# Patient Record
Sex: Male | Born: 1990 | Race: White | Hispanic: No | Marital: Single | Smoking: Current every day smoker
Health system: Southern US, Community
[De-identification: ages and names within clinical notes are randomized; demographics above are authoritative.]

## PROBLEM LIST (undated history)

## (undated) DIAGNOSIS — F1010 Alcohol abuse, uncomplicated: Secondary | ICD-10-CM

## (undated) DIAGNOSIS — K029 Dental caries, unspecified: Secondary | ICD-10-CM

## (undated) DIAGNOSIS — F1410 Cocaine abuse, uncomplicated: Secondary | ICD-10-CM

---
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
## 2013-03-28 ENCOUNTER — Telehealth: Payer: Self-pay | Admitting: Medical

## 2013-03-28 NOTE — Telephone Encounter (Signed)
fyi

## 2013-06-18 ENCOUNTER — Emergency Department (HOSPITAL_COMMUNITY)
Admission: EM | Admit: 2013-06-18 | Discharge: 2013-06-18 | Disposition: A | Discharge status: Home / Self Care | Payer: Medicaid Other | Attending: Emergency Medicine | Admitting: Emergency Medicine

## 2013-06-18 ENCOUNTER — Encounter (HOSPITAL_COMMUNITY): Payer: Self-pay | Admitting: Emergency Medicine

## 2013-06-18 DIAGNOSIS — K089 Disorder of teeth and supporting structures, unspecified: Secondary | ICD-10-CM | POA: Insufficient documentation

## 2013-06-18 DIAGNOSIS — Z79899 Other long term (current) drug therapy: Secondary | ICD-10-CM | POA: Insufficient documentation

## 2013-06-18 DIAGNOSIS — K002 Abnormalities of size and form of teeth: Secondary | ICD-10-CM | POA: Insufficient documentation

## 2013-06-18 DIAGNOSIS — Z791 Long term (current) use of non-steroidal anti-inflammatories (NSAID): Secondary | ICD-10-CM | POA: Insufficient documentation

## 2013-06-18 DIAGNOSIS — K0889 Other specified disorders of teeth and supporting structures: Secondary | ICD-10-CM

## 2013-06-18 DIAGNOSIS — L42 Pityriasis rosea: Secondary | ICD-10-CM

## 2013-06-18 DIAGNOSIS — F17200 Nicotine dependence, unspecified, uncomplicated: Secondary | ICD-10-CM | POA: Insufficient documentation

## 2013-06-18 DIAGNOSIS — K0381 Cracked tooth: Secondary | ICD-10-CM | POA: Insufficient documentation

## 2013-06-18 DIAGNOSIS — K029 Dental caries, unspecified: Secondary | ICD-10-CM | POA: Insufficient documentation

## 2013-06-18 DIAGNOSIS — Z880 Allergy status to penicillin: Secondary | ICD-10-CM | POA: Insufficient documentation

## 2013-06-18 MED ORDER — TRAMADOL HCL 50 MG PO TABS: 50.0000 mg | ORAL_TABLET | Freq: Four times a day (QID) | ORAL | Status: DC | PRN

## 2013-06-18 NOTE — Discharge Instructions (Signed)
Please read and follow all provided instructions.  Your diagnoses today include:  1. Pityriasis rosea   2. Pain, dental     The exam and treatment you received today has been provided on an emergency basis only. This is not a substitute for complete medical or dental care.  Tests performed today include:  Vital signs. See below for your results today.   Medications prescribed:   Tramadol - narcotic-like pain medication  DO NOT drive or perform any activities that require you to be awake and alert because this medicine can make you drowsy.   Take any prescribed medications only as directed.  Home care instructions:  Follow any educational materials contained in this packet.  You can use steroid cream on itchy areas of rash or use oral benadryl as needed.   Follow-up instructions: Please follow-up with your dentist for further evaluation of your symptoms. If you do not have a dentist or primary care doctor -- see below for referral information.   Dental Assistance: See below for dental referrals  Return instructions:   Please return to the Emergency Department if you experience worsening symptoms.  Please return if you develop a fever, you develop more swelling in your face or neck, you have trouble breathing or swallowing food.  Please return if you have any other emergent concerns.  Additional Information:  Your vital signs today were: BP 125/70   Pulse 98   Temp(Src) 98.7 F (37.1 C) (Oral)   Resp 16   Ht 5\' 8"  (1.727 m)   Wt 135 lb (61.236 kg)   BMI 20.53 kg/m2   SpO2 99% If your blood pressure (BP) was elevated above 135/85 this visit, please have this repeated by your doctor within one month. -------------- Dental Care: Organization         Address  Phone  Notes  [facility] Department of [facility] [address] [place], [place] [phone] Accepts children up to age 70 who are enrolled in [place] or [place] Health Choice; pregnant  women with a Medicaid card; and children who have applied for Medicaid or [place] Health Choice, but were declined, whose parents can pay a reduced fee at time of service.  [facility]  [address], [place] [phone] Accepts children up to age 35 who are enrolled in [place] or [place] Health Choice; pregnant women with a Medicaid card; and children who have applied for Medicaid or [place] Health Choice, but were declined, whose parents can pay a reduced fee at time of service.  [facility]  [address] [place], [place] [phone] Patients are seen by appointment only. Walk-ins are not accepted. [facility] will see patients 69 years of age and older. Monday - Tuesday (8am-5pm) Most Wednesdays (8:30-5pm) $30 per visit, cash only  [facility]  [address], [facility] [phone] Patients are seen by appointment only. Walk-ins are not accepted. [facility] will see patients 27 years of age and older. One Wednesday Evening (Monthly: Volunteer Based).  $30 per visit, cash only  [employer]  [phone] for adults; Children under age 74, call Graduate Pediatric Dentistry at [phone]. Children aged 87-14, please call [phone] to request a pediatric application.  Dental services are provided in all areas of dental care including fillings, crowns and bridges, complete and partial dentures, implants, gum treatment, root canals, and  extractions. Preventive care is also provided. Treatment is provided to both adults and children. Patients are selected via a lottery and there is often a waiting list.   [facility] [address], [place]  [phone] [email or web address]   [facility] [address], [place], [place] [phone], [phone]. [phone] Second and Fourth Thursday of each month, opens at 6:30 AM; Clinic ends at 9 AM.  Patients are  seen on a first-come first-served basis, and a limited number are seen during each clinic.   [facility]  [address], [place], [place] [phone]   Eligibility Requirements You must have lived in [place], [place], or [place] counties for at least the last three months.   You cannot be eligible for state or federal sponsored [place] insurance, including [employer], [place], or [employer]are.   You generally cannot be eligible for healthcare insurance through your employer.    How to apply: Eligibility screenings are held every Tuesday and Wednesday afternoon from 1:00 pm until 4:00 pm. You do not need an appointment for the interview!  [facility] [address], [place], [place] [phone]   [facility]  [phone]   [facility]  [phone]   [facility]  [phone]

## 2013-06-18 NOTE — ED Provider Notes (Signed)
CSN: [id number]     Arrival date & time 06/18/13  1721 History   First MD Initiated Contact with Patient 06/18/13 1726     This chart was scribed for non-physician practitioner, Rhea Bleacher PA-C working with Toy Baker, MD by Arlan Organ, ED Scribe. This patient was seen in room TR09C/TR09C and the patient's care was started at 5:51 PM.   Chief Complaint  Patient presents with  . Rash   The history is provided by the patient. No language interpreter was used.    HPI Comments: Matthew Reyes is a 23 y.o. male who presents to the Emergency Department complaining of a sudden onset, moderate rash to the right thigh that initially appeared 1 week ago. He states the initial rash just came up, and denies any radiation or gradual increase in size. Pt reports noting new rashes to his lower abdomen,  back, and left forearm onset yesterday. Pt suspected the spots may possibly be ringworm as he reports previously having ringworm in the past. He denies any itching to the areas. Denies fever or chills.  Pt also reports dental pain to his top upper right molar. He states his filling may have fallen out. He admits to generalized pain to the area. He has tried ibuprofen with no improvement.   History reviewed. No pertinent past medical history. History reviewed. No pertinent past surgical history. No family history on file. History  Substance Use Topics  . Smoking status: Current Every Day Smoker -- 0.50 packs/day    Types: Cigarettes  . Smokeless tobacco: Not on file  . Alcohol Use: 1.2 oz/week    2 Shots of liquor per week     Comment: occasionally     Review of Systems  Constitutional: Negative for fever and chills.  HENT: Positive for dental problem. Negative for facial swelling and trouble swallowing.   Eyes: Negative for redness.  Respiratory: Negative for shortness of breath, wheezing and stridor.   Cardiovascular: Negative for chest pain.  Gastrointestinal: Negative for nausea and  vomiting.  Musculoskeletal: Negative for myalgias.  Skin: Positive for rash.  Neurological: Negative for light-headedness.  Psychiatric/Behavioral: Negative for confusion.    Allergies  Amoxicillin  Home Medications   Current Outpatient Rx  Name  Route  Sig  Dispense  Refill  . methocarbamol (ROBAXIN) 500 MG tablet   Oral   Take 1 tablet (500 mg total) by mouth 2 (two) times daily.   20 tablet   0   . naproxen (NAPROSYN) 500 MG tablet   Oral   Take 1 tablet (500 mg total) by mouth 2 (two) times daily.   30 tablet   0   . traMADol (ULTRAM) 50 MG tablet   Oral   Take 1 tablet (50 mg total) by mouth every 6 (six) hours as needed.   15 tablet   0     Triage Vitals: BP 125/70  Pulse 98  Temp(Src) 98.7 F (37.1 C) (Oral)  Resp 16  Ht 5\' 8"  (1.727 m)  Wt 135 lb (61.236 kg)  BMI 20.53 kg/m2  SpO2 99%  Physical Exam  Nursing note and vitals reviewed. Constitutional: He is oriented to person, place, and time. He appears well-developed and well-nourished.  HENT:  Head: Normocephalic and atraumatic.  Right Ear: Tympanic membrane, external ear and ear canal normal.  Left Ear: Tympanic membrane, external ear and ear canal normal.  Nose: Nose normal.  Mouth/Throat: Uvula is midline, oropharynx is clear and moist and mucous membranes  are normal. No trismus in the jaw. Abnormal dentition. Dental caries present. No dental abscesses or uvula swelling. No tonsillar abscesses.  Poor dentition. Broken tooth noted upper R molar. No signs of infection.   Eyes: Conjunctivae and EOM are normal. Pupils are equal, round, and reactive to light.  Neck: Normal range of motion. Neck supple.  No neck swelling or Lugwig's angina  Cardiovascular: Normal rate.   Pulmonary/Chest: Effort normal. No respiratory distress.  Musculoskeletal: Normal range of motion.  Neurological: He is alert and oriented to person, place, and time.  Skin: Skin is warm and dry.  Patient with few scattered ovoid  pink colored macules over abdomen, back, and forearms < 1cm diameter. There is a larger scaling macule R thigh. This rash is consistent with pityriasis rosea.   Psychiatric: He has a normal mood and affect. His behavior is normal.    ED Course  Procedures (including critical care time)  DIAGNOSTIC STUDIES: Oxygen Saturation is 99% on RA, Normal by my interpretation.    COORDINATION OF CARE: 5:55 PM- Will give tramadol for dental pain. Discussed treatment plan with pt at bedside and pt agreed to plan.     Labs Review Labs Reviewed - No data to display Imaging Review No results found.  EKG Interpretation   None      Vital signs reviewed and are as follows: Filed Vitals:   06/18/13 1730  BP: 125/70  Pulse: 98  Temp: 98.7 F (37.1 C)  Resp: 16   For rash: Counseled on course, he is to use benadryl if needed.   For tooth: Patient counseled to take prescribed medications as directed, return with worsening facial or neck swelling, and to follow-up with their dentist as soon as possible.   Patient counseled on use of narcotic pain medications. Counseled not to combine these medications with others containing tylenol. Urged not to drink alcohol, drive, or perform any other activities that requires focus while taking these medications. The patient verbalizes understanding and agrees with the plan.   MDM   1. Pityriasis rosea   2. Pain, dental    Rash: consistent with pityriasis rosea, herald patch on leg. Supportive treatment indicated.  Tooth: Patient with toothache.  No gross abscess.  Exam unconcerning for Ludwig's angina or other deep tissue infection in neck.  Will treat with pain medicine.  Urged patient to follow-up with dentist.     I personally performed the services described in this documentation, which was scribed in my presence. The recorded information has been reviewed and is accurate.   Renne CriglerJoshua Larayah Clute, PA-C 06/20/13 [phone]

## 2013-06-19 ENCOUNTER — Emergency Department (HOSPITAL_COMMUNITY)
Admission: EM | Admit: 2013-06-19 | Discharge: 2013-06-20 | Disposition: A | Discharge status: Home / Self Care | Payer: Medicaid Other | Attending: Emergency Medicine | Admitting: Emergency Medicine

## 2013-06-19 ENCOUNTER — Emergency Department (HOSPITAL_COMMUNITY): Payer: Medicaid Other

## 2013-06-19 ENCOUNTER — Encounter (HOSPITAL_COMMUNITY): Payer: Self-pay | Admitting: Emergency Medicine

## 2013-06-19 DIAGNOSIS — Z880 Allergy status to penicillin: Secondary | ICD-10-CM | POA: Insufficient documentation

## 2013-06-19 DIAGNOSIS — M25519 Pain in unspecified shoulder: Secondary | ICD-10-CM

## 2013-06-19 DIAGNOSIS — S43499A Other sprain of unspecified shoulder joint, initial encounter: Secondary | ICD-10-CM | POA: Insufficient documentation

## 2013-06-19 DIAGNOSIS — X58XXXA Exposure to other specified factors, initial encounter: Secondary | ICD-10-CM | POA: Insufficient documentation

## 2013-06-19 DIAGNOSIS — Y929 Unspecified place or not applicable: Secondary | ICD-10-CM | POA: Insufficient documentation

## 2013-06-19 DIAGNOSIS — S46811A Strain of other muscles, fascia and tendons at shoulder and upper arm level, right arm, initial encounter: Secondary | ICD-10-CM

## 2013-06-19 DIAGNOSIS — Y939 Activity, unspecified: Secondary | ICD-10-CM | POA: Insufficient documentation

## 2013-06-19 DIAGNOSIS — M546 Pain in thoracic spine: Secondary | ICD-10-CM | POA: Insufficient documentation

## 2013-06-19 DIAGNOSIS — S46819A Strain of other muscles, fascia and tendons at shoulder and upper arm level, unspecified arm, initial encounter: Principal | ICD-10-CM

## 2013-06-19 DIAGNOSIS — F17200 Nicotine dependence, unspecified, uncomplicated: Secondary | ICD-10-CM | POA: Insufficient documentation

## 2013-06-19 MED ORDER — OXYCODONE-ACETAMINOPHEN 5-325 MG PO TABS
1.0000 | ORAL_TABLET | Freq: Once | ORAL | Status: AC
  Administered 2013-06-19: 1 via ORAL
  Filled 2013-06-19: qty 1
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
## 2013-06-19 NOTE — ED Notes (Signed)
Dr. Rancour at bedside. 

## 2013-06-19 NOTE — ED Notes (Signed)
Pt returned from X-ray.  

## 2013-06-19 NOTE — ED Notes (Signed)
Report given to Susannah Carbin B. RN
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
## 2013-06-20 LAB — D-DIMER, QUANTITATIVE (NOT AT ARMC)

## 2013-06-20 MED ORDER — NAPROXEN 500 MG PO TABS: 500.0000 mg | ORAL_TABLET | Freq: Two times a day (BID) | ORAL | Status: DC

## 2013-06-20 MED ORDER — METHOCARBAMOL 500 MG PO TABS: 500.0000 mg | ORAL_TABLET | Freq: Two times a day (BID) | ORAL | Status: DC

## 2013-06-20 NOTE — Discharge Instructions (Signed)
Arthralgia  Your caregiver has diagnosed you as suffering from an arthralgia. Arthralgia means there is pain in a joint. This can come from many reasons including:  · Bruising the joint which causes soreness (inflammation) in the joint.  · Wear and tear on the joints which occur as we grow older (osteoarthritis).  · Overusing the joint.  · Various forms of arthritis.  · Infections of the joint.  Regardless of the cause of pain in your joint, most of these different pains respond to anti-inflammatory drugs and rest. The exception to this is when a joint is infected, and these cases are treated with antibiotics, if it is a bacterial infection.  HOME CARE INSTRUCTIONS   · Rest the injured area for as long as directed by your caregiver. Then slowly start using the joint as directed by your caregiver and as the pain allows. Crutches as directed may be useful if the ankles, knees or hips are involved. If the knee was splinted or casted, continue use and care as directed. If an stretchy or elastic wrapping bandage has been applied today, it should be removed and re-applied every 3 to 4 hours. It should not be applied tightly, but firmly enough to keep swelling down. Watch toes and feet for swelling, bluish discoloration, coldness, numbness or excessive pain. If any of these problems (symptoms) occur, remove the ace bandage and re-apply more loosely. If these symptoms persist, contact your caregiver or return to this location.  · For the first 24 hours, keep the injured extremity elevated on pillows while lying down.  · Apply ice for 15-20 minutes to the sore joint every couple hours while awake for the first half day. Then 03-04 times per day for the first 48 hours. Put the ice in a plastic bag and place a towel between the bag of ice and your skin.  · Wear any splinting, casting, elastic bandage applications, or slings as instructed.  · Only take over-the-counter or prescription medicines for pain, discomfort, or fever as  directed by your caregiver. Do not use aspirin immediately after the injury unless instructed by your physician. Aspirin can cause increased bleeding and bruising of the tissues.  · If you were given crutches, continue to use them as instructed and do not resume weight bearing on the sore joint until instructed.  Persistent pain and inability to use the sore joint as directed for more than 2 to 3 days are warning signs indicating that you should see a caregiver for a follow-up visit as soon as possible. Initially, a hairline fracture (break in bone) may not be evident on X-rays. Persistent pain and swelling indicate that further evaluation, non-weight bearing or use of the joint (use of crutches or slings as instructed), or further X-rays are indicated. X-rays may sometimes not show a small fracture until a week or 10 days later. Make a follow-up appointment with your own caregiver or one to whom we have referred you. A radiologist (specialist in reading X-rays) may read your X-rays. Make sure you know how you are to obtain your X-ray results. Do not assume everything is normal if you do not hear from us.  SEEK MEDICAL CARE IF:  Bruising, swelling, or pain increases.  SEEK IMMEDIATE MEDICAL CARE IF:   · Your fingers or toes are numb or blue.  · The pain is not responding to medications and continues to stay the same or get worse.  · The pain in your joint becomes severe.  · You   develop a fever over 102° F (38.9° C).  · It becomes impossible to move or use the joint.  MAKE SURE YOU:   · Understand these instructions.  · Will watch your condition.  · Will get help right away if you are not doing well or get worse.  Document Released: 05/25/2005 Document Revised: 08/17/2011 Document Reviewed: 01/11/2008  Exit[employer]® Patient Information ©2014 [employer].

## 2013-06-21 NOTE — ED Provider Notes (Signed)
Medical screening examination/treatment/procedure(s) were performed by non-physician practitioner and as supervising physician I was immediately available for consultation/collaboration.  Ceira Hoeschen T Bartt Gonzaga, MD 06/21/13 1934 

## 2014-08-24 ENCOUNTER — Encounter (HOSPITAL_COMMUNITY): Payer: Self-pay | Admitting: Emergency Medicine

## 2014-08-24 ENCOUNTER — Emergency Department (HOSPITAL_COMMUNITY)
Admission: EM | Admit: 2014-08-24 | Discharge: 2014-08-24 | Disposition: A | Discharge status: Home / Self Care | Payer: Medicaid Other | Attending: Emergency Medicine | Admitting: Emergency Medicine

## 2014-08-24 DIAGNOSIS — Z791 Long term (current) use of non-steroidal anti-inflammatories (NSAID): Secondary | ICD-10-CM | POA: Insufficient documentation

## 2014-08-24 DIAGNOSIS — Z880 Allergy status to penicillin: Secondary | ICD-10-CM | POA: Insufficient documentation

## 2014-08-24 DIAGNOSIS — Z720 Tobacco use: Secondary | ICD-10-CM | POA: Insufficient documentation

## 2014-08-24 DIAGNOSIS — K029 Dental caries, unspecified: Secondary | ICD-10-CM | POA: Insufficient documentation

## 2014-08-24 DIAGNOSIS — K088 Other specified disorders of teeth and supporting structures: Secondary | ICD-10-CM | POA: Insufficient documentation

## 2014-08-24 DIAGNOSIS — K002 Abnormalities of size and form of teeth: Secondary | ICD-10-CM | POA: Insufficient documentation

## 2014-08-24 DIAGNOSIS — Z79899 Other long term (current) drug therapy: Secondary | ICD-10-CM | POA: Insufficient documentation

## 2014-08-24 MED ORDER — DOXYCYCLINE HYCLATE 100 MG PO CAPS: 100.0000 mg | ORAL_CAPSULE | Freq: Two times a day (BID) | ORAL | Status: DC

## 2014-08-24 MED ORDER — NAPROXEN 500 MG PO TABS: 500.0000 mg | ORAL_TABLET | Freq: Two times a day (BID) | ORAL | Status: DC | PRN

## 2014-08-24 MED ORDER — HYDROCODONE-ACETAMINOPHEN 5-325 MG PO TABS
1.0000 | ORAL_TABLET | Freq: Once | ORAL | Status: AC
  Administered 2014-08-24: 1 via ORAL
  Filled 2014-08-24: qty 1

## 2014-08-24 MED ORDER — HYDROCODONE-ACETAMINOPHEN 5-325 MG PO TABS: 1.0000 | ORAL_TABLET | Freq: Four times a day (QID) | ORAL | Status: DC | PRN

## 2014-08-24 NOTE — Discharge Instructions (Signed)
Apply warm compresses to jaw throughout the day. Take antibiotic until finished, use sunscreen and wear skin protecting clothing. Alternate between naprosyn and norco as directed, as needed for pain but do not drive or operate machinery with pain medication use. Followup with a dentist is very important for ongoing evaluation and management of recurrent dental pain. Call the dentist listed above today, or use the list below to find a dentist. STOP SMOKING! Return to emergency department for emergent changing or worsening symptoms.    Dental Pain A tooth ache may be caused by cavities (tooth decay). Cavities expose the nerve of the tooth to air and hot or cold temperatures. It may come from an infection or abscess (also called a boil or furuncle) around your tooth. It is also often caused by dental caries (tooth decay). This causes the pain you are having. DIAGNOSIS  Your caregiver can diagnose this problem by exam. TREATMENT   If caused by an infection, it may be treated with medications which kill germs (antibiotics) and pain medications as prescribed by your caregiver. Take medications as directed.  Only take over-the-counter or prescription medicines for pain, discomfort, or fever as directed by your caregiver.  Whether the tooth ache today is caused by infection or dental disease, you should see your dentist as soon as possible for further care. SEEK MEDICAL CARE IF: The exam and treatment you received today has been provided on an emergency basis only. This is not a substitute for complete medical or dental care. If your problem worsens or new problems (symptoms) appear, and you are unable to meet with your dentist, call or return to this location. SEEK IMMEDIATE MEDICAL CARE IF:   You have a fever.  You develop redness and swelling of your face, jaw, or neck.  You are unable to open your mouth.  You have severe pain uncontrolled by pain medicine. MAKE SURE YOU:   Understand these  instructions.  Will watch your condition.  Will get help right away if you are not doing well or get worse. Document Released: 05/25/2005 Document Revised: 08/17/2011 Document Reviewed: 01/11/2008 [facility] Patient Information 2015 [place], [place]. This information is not intended to replace advice given to you by your health care provider. Make sure you discuss any questions you have with your health care provider.  Dental Caries Dental caries (also called tooth decay) is the most common oral disease. It can occur at any age but is more common in children and young adults.  HOW DENTAL CARIES DEVELOPS  The process of decay begins when bacteria and foods (particularly sugars and starches) combine in your mouth to produce plaque. Plaque is a substance that sticks to the hard, outer surface of a tooth (enamel). The bacteria in plaque produce acids that attack enamel. These acids may also attack the root surface of a tooth (cementum) if it is exposed. Repeated attacks dissolve these surfaces and create holes in the tooth (cavities). If left untreated, the acids destroy the other layers of the tooth.  RISK FACTORS  Frequent sipping of sugary beverages.   Frequent snacking on sugary and starchy foods, especially those that easily get stuck in the teeth.   Poor oral hygiene.   Dry mouth.   Substance abuse such as methamphetamine abuse.   Broken or poor-fitting dental restorations.   Eating disorders.   Gastroesophageal reflux disease (GERD).   Certain radiation treatments to the head and neck. SYMPTOMS In the early stages of dental caries, symptoms are seldom present. Sometimes  white, chalky areas may be seen on the enamel or other tooth layers. In later stages, symptoms may include:  Pits and holes on the enamel.  Toothache after sweet, hot, or cold foods or drinks are consumed.  Pain around the tooth.  Swelling around the tooth. DIAGNOSIS  Most of the time, dental caries  is detected during a regular dental checkup. A diagnosis is made after a thorough medical and dental history is taken and the surfaces of your teeth are checked for signs of dental caries. Sometimes special instruments, such as lasers, are used to check for dental caries. Dental X-ray exams may be taken so that areas not visible to the eye (such as between the contact areas of the teeth) can be checked for cavities.  TREATMENT  If dental caries is in its early stages, it may be reversed with a fluoride treatment or an application of a remineralizing agent at the dental office. Thorough brushing and flossing at home is needed to aid these treatments. If it is in its later stages, treatment depends on the location and extent of tooth destruction:   If a small area of the tooth has been destroyed, the destroyed area will be removed and cavities will be filled with a material such as gold, silver amalgam, or composite resin.   If a large area of the tooth has been destroyed, the destroyed area will be removed and a cap (crown) will be fitted over the remaining tooth structure.   If the center part of the tooth (pulp) is affected, a procedure called a root canal will be needed before a filling or crown can be placed.   If most of the tooth has been destroyed, the tooth may need to be pulled (extracted). HOME CARE INSTRUCTIONS You can prevent, stop, or reverse dental caries at home by practicing good oral hygiene. Good oral hygiene includes:  Thoroughly cleaning your teeth at least twice a day with a toothbrush and dental floss.   Using a fluoride toothpaste. A fluoride mouth rinse may also be used if recommended by your dentist or health care provider.   Restricting the amount of sugary and starchy foods and sugary liquids you consume.   Avoiding frequent snacking on these foods and sipping of these liquids.   Keeping regular visits with a dentist for checkups and cleanings. PREVENTION    Practice good oral hygiene.  Consider a dental sealant. A dental sealant is a coating material that is applied by your dentist to the pits and grooves of teeth. The sealant prevents food from being trapped in them. It may protect the teeth for several years.  Ask about fluoride supplements if you live in a community without fluorinated water or with water that has a low fluoride content. Use fluoride supplements as directed by your dentist or health care provider.  Allow fluoride varnish applications to teeth if directed by your dentist or health care provider. Document Released: 02/14/2002 Document Revised: 10/09/2013 Document Reviewed: 05/27/2012 [facility] Patient Information 2015 [place]. This information is not intended to replace advice given to you by your health care provider. Make sure you discuss any questions you have with your health care provider.   Emergency Department Resource Guide 1) Find a Doctor and Pay Out of Pocket Although you won't have to find out who is covered by your insurance plan, it is a good idea to ask around and get recommendations. You will then need to call the office and see if the doctor  you have chosen will accept you as a new patient and what types of options they offer for patients who are self-pay. Some doctors offer discounts or will set up payment plans for their patients who do not have insurance, but you will need to ask so you aren't surprised when you get to your appointment.  2) Contact Your Local Health Department Not all health departments have doctors that can see patients for sick visits, but many do, so it is worth a call to see if yours does. If you don't know where your local health department is, you can check in your phone book. The CDC also has a tool to help you locate your state's health department, and many state websites also have listings of all of their local health departments.  3) Find a Walk-in Clinic If your illness is  not likely to be very severe or complicated, you may want to try a walk in clinic. These are popping up all over the country in pharmacies, drugstores, and shopping centers. They're usually staffed by nurse practitioners or physician assistants that have been trained to treat common illnesses and complaints. They're usually fairly quick and inexpensive. However, if you have serious medical issues or chronic medical problems, these are probably not your best option.  No Primary Care Doctor: - Call Health Connect at  [phone] - they can help you locate a primary care doctor that  accepts your insurance, provides certain services, etc. - Physician Referral Service- [phone]  Chronic Pain Problems: Organization         Address  Phone   Notes  [facility]  [phone] Patients need to be referred by their primary care doctor.   Medication Assistance: Organization         Address  Phone   Notes  [facility] Medication [facility] [address] [place], [place] [address] [phone] --Must be a resident of [facility] -- Must have NO insurance coverage whatsoever (no Medicaid/ Medicare, etc.) -- The pt. MUST have a primary care doctor that directs their care regularly and follows them in the community   [facility]  [phone]   [place]  [phone]     Dental Care: Organization         Address  Phone  Notes  [facility] Department of [facility] [address] [place], [place] [phone] Accepts children up to age 81 who are enrolled in [place] or [place] Health Choice; pregnant women with a Medicaid card; and children who have applied for Medicaid or [place] Health Choice, but were declined, whose parents can pay a reduced fee at time of service.  [facility] Department of [facility]  [address], [place] [phone] Accepts children up to age 29 who are enrolled in [place]  or [place] Health Choice; pregnant women with a Medicaid card; and children who have applied for Medicaid or [place] Health Choice, but were declined, whose parents can pay a reduced fee at time of service.  [facility]  [address] [place], [place] [phone] Patients are seen by appointment only. Walk-ins are not accepted. [facility] will see patients 67 years of age and older. Monday - Tuesday (8am-5pm) Most Wednesdays (8:30-5pm) $30 per visit, cash only  [facility]  [address], [facility] [phone] Patients are seen by appointment only. Walk-ins are not  accepted. [facility] will see patients 73 years of age and older. One Wednesday Evening (Monthly: Volunteer Based).  $30 per visit, cash only  [employer]  [phone] for adults; Children under age 38, call Graduate Pediatric Dentistry at [phone]. Children aged 48-14, please call [phone] to request a pediatric application.  Dental services are provided in all areas of dental care including fillings, crowns and bridges, complete and partial dentures, implants, gum treatment, root canals, and extractions. Preventive care is also provided. Treatment is provided to both adults and children. Patients are selected via a lottery and there is often a waiting list.   [facility] [address], [place]  [phone] [email or web address]   [facility] [address] [place], [place] [phone], Ext. [phone] Second and Fourth Thursday of each month, opens at 6:30 AM; Clinic ends at 9 AM.  Patients are seen on a first-come first-served basis, and a limited number are seen during each clinic.   [facility]  [address] [place], [place] [phone]   Eligibility Requirements You must have lived in [place], [place], or [place] counties for at least the last three months.   You cannot be eligible for  state or federal sponsored [place], including [employer], [place], or [employer].   You generally cannot be eligible for healthcare insurance through your employer.    How to apply: Eligibility screenings are held every Tuesday and Wednesday afternoon from 1:00 pm until 4:00 pm. You do not need an appointment for the interview!  [facility] [address], [place], [place] [phone]   [facility] Department  [phone]   [facility]  [phone]   [facility]  [phone]

## 2014-08-24 NOTE — ED Provider Notes (Signed)
CSN: [id number]     Arrival date & time 08/24/14  1101 History   First MD Initiated Contact with Patient 08/24/14 1128     Chief Complaint  Patient presents with  . Dental Pain    Onset was yesterday     (Consider location/radiation/quality/duration/timing/severity/associated sxs/prior Treatment) HPI Comments: Matthew Reyes is a 24 y.o. Male who presents to the ED with complaints of right upper molar dental pain that began last night after the entire tooth broke off. He reports he had pain in this tooth for several months, but yesterday when it broke and became much worse. He reports the pain is 9/10 intermittent throbbing, nonradiating, worse with cold air exposure, improved mildly with warm liquids, and unrelieved with ibuprofen. He admits to smoking and does not have dental care. Denies any facial or come swelling, come drainage or bleeding, trismus, drooling, difficulty swallowing, sore throat, neck pain or swelling, ear pain or drainage, fevers, chest pain, shortness breath, abdominal pain, nausea, or vomiting.  Patient is a 24 y.o. male presenting with tooth pain. The history is provided by the patient. No language interpreter was used.  Dental Pain Location:  Upper Upper teeth location:  1/RU 3rd molar Quality:  Throbbing Severity:  Severe (9/10) Onset quality:  Sudden Duration:  1 day Timing:  Intermittent Progression:  Unchanged Chronicity:  New Context: dental fracture (entire tooth broke off) and poor dentition   Relieved by:  Heat Exacerbated by: cold air. Ineffective treatments:  NSAIDs Associated symptoms: no congestion, no difficulty swallowing, no drooling, no facial pain, no facial swelling, no fever, no gum swelling, no headaches, no neck pain, no neck swelling, no oral bleeding, no oral lesions and no trismus   Risk factors: lack of dental care and smoking   Risk factors: no diabetes and no immunosuppression     History reviewed. No pertinent past medical  history. History reviewed. No pertinent past surgical history. No family history on file. History  Substance Use Topics  . Smoking status: Current Every Day Smoker -- 0.50 packs/day    Types: Cigarettes  . Smokeless tobacco: Not on file  . Alcohol Use: 1.2 oz/week    2 Shots of liquor per week     Comment: occasionally     Review of Systems  Constitutional: Negative for fever and chills.  HENT: Positive for dental problem. Negative for congestion, drooling, ear discharge, ear pain, facial swelling, mouth sores, rhinorrhea, sore throat and trouble swallowing.   Respiratory: Negative for shortness of breath.   Cardiovascular: Negative for chest pain.  Gastrointestinal: Negative for nausea, vomiting, abdominal pain and diarrhea.  Musculoskeletal: Negative for myalgias, arthralgias and neck pain.  Skin: Negative for color change.  Allergic/Immunologic: Negative for immunocompromised state.  Neurological: Negative for weakness, numbness and headaches.   10 Systems reviewed and are negative for acute change except as noted in the HPI.    Allergies  Amoxicillin  Home Medications   Prior to Admission medications   Medication Sig Start Date End Date Taking? Authorizing Provider  methocarbamol (ROBAXIN) 500 MG tablet Take 1 tablet (500 mg total) by mouth 2 (two) times daily. 06/20/13   Glynn Octave, MD  naproxen (NAPROSYN) 500 MG tablet Take 1 tablet (500 mg total) by mouth 2 (two) times daily. 06/20/13   Glynn Octave, MD  traMADol (ULTRAM) 50 MG tablet Take 1 tablet (50 mg total) by mouth every 6 (six) hours as needed. 06/18/13   Renne Crigler, PA-C   BP 125/86 mmHg  Pulse 80  Temp(Src) 98 F (36.7 C) (Oral)  Resp 16  Wt 140 lb (63.504 kg)  SpO2 99% Physical Exam  Constitutional: He is oriented to person, place, and time. Vital signs are normal. He appears well-developed and well-nourished.  Non-toxic appearance. No distress.  Afebrile, nontoxic, NAD  HENT:  Head:  Normocephalic and atraumatic.  Nose: Nose normal.  Mouth/Throat: Oropharynx is clear and moist and mucous membranes are normal. No trismus in the jaw. Abnormal dentition. Dental caries present. No dental abscesses or uvula swelling.    Poor oral dentitia throughout. L upper molar #1 completely broken off with TTP to the exposed gumline, without erythema or swelling, no drainage, no trismus or drooling, oropharynx clear and moist without tonsillar swelling or exudates. Nose clear. No facial swelling.  Eyes: Conjunctivae and EOM are normal. Right eye exhibits no discharge. Left eye exhibits no discharge.  Neck: Normal range of motion. Neck supple.  Cardiovascular: Normal rate.   Pulmonary/Chest: Effort normal. No respiratory distress.  Abdominal: Normal appearance. He exhibits no distension.  Musculoskeletal: Normal range of motion.  Lymphadenopathy:       Head (right side): Submandibular adenopathy present.  Mild R sided submandibular reactive LAD, mildly TTP  Neurological: He is alert and oriented to person, place, and time. He has normal strength. No sensory deficit.  Skin: Skin is warm, dry and intact. No rash noted.  Psychiatric: He has a normal mood and affect.  Nursing note and vitals reviewed.   ED Course  Procedures (including critical care time) Labs Review Labs Reviewed - No data to display  Imaging Review No results found.   EKG Interpretation None      MDM   Final diagnoses:  Pain due to dental caries  Dental decay    24 y.o. male here with Dental pain associated with broken tooth (completely fell out) and possible dental infection with patient afebrile, non toxic appearing and swallowing secretions well. I gave patient referral to dentist and stressed the importance of dental follow up for ultimate management of dental pain.  I have also discussed reasons to return immediately to the ER.  Patient expresses understanding and agrees with plan.  I will also give  doxycycline and pain control. Counseled on smoking cessation.  BP 125/86 mmHg  Pulse 80  Temp(Src) 98 F (36.7 C) (Oral)  Resp 16  Wt 140 lb (63.504 kg)  SpO2 99%  Meds ordered this encounter  Medications  . HYDROcodone-acetaminophen (NORCO/VICODIN) 5-325 MG per tablet 1 tablet    Sig:   . doxycycline (VIBRAMYCIN) 100 MG capsule    Sig: Take 1 capsule (100 mg total) by mouth 2 (two) times daily. One po bid x 7 days    Dispense:  14 capsule    Refill:  0    Order Specific Question:  Supervising Provider    Answer:  MILLER, BRIAN [[id number]]  . HYDROcodone-acetaminophen (NORCO) 5-325 MG per tablet    Sig: Take 1 tablet by mouth every 6 (six) hours as needed for severe pain.    Dispense:  15 tablet    Refill:  0    Order Specific Question:  Supervising Provider    Answer:  MILLER, BRIAN [[id number]]  . naproxen (NAPROSYN) 500 MG tablet    Sig: Take 1 tablet (500 mg total) by mouth 2 (two) times daily as needed for mild pain, moderate pain or headache (TAKE WITH MEALS.).    Dispense:  20 tablet    Refill:  0  Order Specific Question:  Supervising Provider    Answer:  Eber Hong [address] [place], PA-C 08/24/14 1159  Tilden Fossa, MD 08/24/14 1501

## 2014-11-12 ENCOUNTER — Encounter (HOSPITAL_COMMUNITY): Payer: Self-pay | Admitting: Emergency Medicine

## 2014-11-12 ENCOUNTER — Emergency Department (HOSPITAL_COMMUNITY)
Admission: EM | Admit: 2014-11-12 | Discharge: 2014-11-12 | Disposition: A | Discharge status: Home / Self Care | Payer: Medicaid Other | Attending: Emergency Medicine | Admitting: Emergency Medicine

## 2014-11-12 DIAGNOSIS — K029 Dental caries, unspecified: Secondary | ICD-10-CM | POA: Insufficient documentation

## 2014-11-12 DIAGNOSIS — Z791 Long term (current) use of non-steroidal anti-inflammatories (NSAID): Secondary | ICD-10-CM | POA: Insufficient documentation

## 2014-11-12 DIAGNOSIS — G8929 Other chronic pain: Secondary | ICD-10-CM | POA: Insufficient documentation

## 2014-11-12 DIAGNOSIS — Z880 Allergy status to penicillin: Secondary | ICD-10-CM | POA: Insufficient documentation

## 2014-11-12 DIAGNOSIS — Z79899 Other long term (current) drug therapy: Secondary | ICD-10-CM | POA: Insufficient documentation

## 2014-11-12 DIAGNOSIS — Z720 Tobacco use: Secondary | ICD-10-CM | POA: Insufficient documentation

## 2014-11-12 HISTORY — DX: Dental caries, unspecified: K02.9

## 2014-11-12 MED ORDER — DOXYCYCLINE HYCLATE 100 MG PO TABS
100.0000 mg | ORAL_TABLET | Freq: Once | ORAL | Status: AC
  Administered 2014-11-12: 100 mg via ORAL
  Filled 2014-11-12: qty 1

## 2014-11-12 MED ORDER — DOXYCYCLINE HYCLATE 100 MG PO CAPS: 100.0000 mg | ORAL_CAPSULE | Freq: Two times a day (BID) | ORAL | Status: DC

## 2014-11-12 MED ORDER — HYDROCODONE-ACETAMINOPHEN 5-325 MG PO TABS
2.0000 | ORAL_TABLET | Freq: Once | ORAL | Status: AC
  Administered 2014-11-12: 2 via ORAL
  Filled 2014-11-12: qty 2

## 2014-11-12 MED ORDER — HYDROCODONE-ACETAMINOPHEN 5-325 MG PO TABS: 2.0000 | ORAL_TABLET | ORAL | Status: DC | PRN

## 2014-11-12 NOTE — ED Notes (Signed)
Pt reports pain in r/upper jaw x 2 days

## 2014-11-12 NOTE — ED Provider Notes (Signed)
CSN: [id number]     Arrival date & time 11/12/14  1656 History  This chart was scribed for non-physician practitioner, Catha Gosselin, working with Gerhard Munch, MD by Richarda Overlie, ED Scribe. This patient was seen in room WTR7/WTR7 and the patient's care was started at 5:29 PM.   Chief Complaint  Patient presents with  . Dental Pain    r/upper jaw pain   The history is provided by the patient and a friend. No language interpreter was used.   HPI Comments: Matthew Reyes is a 24 y.o. male who presents to the Emergency Department complaining of right upper dental pain for the last 2 days. Pt reports he broke a right upper molar about 1 year ago. Pt reports a similar prior episode and says he thinks he was told it was due to nerve pain. He states that he has left over doxycycline from his last episode. Pt states that he last saw a dentist when he was 24 yo. Pt reports he is allergic to amoxicillin. He denies fever, facial swelling, trismus, difficulty swallowing, sore throat, neck pain, nausea or vomiting.  Past Medical History  Diagnosis Date  . Dental caries    History reviewed. No pertinent past surgical history. History reviewed. No pertinent family history. History  Substance Use Topics  . Smoking status: Current Every Day Smoker -- 0.50 packs/day    Types: Cigarettes  . Smokeless tobacco: Not on file  . Alcohol Use: 1.2 oz/week    2 Shots of liquor per week     Comment: occasionally     Review of Systems  Constitutional: Negative for fever.  HENT: Positive for dental problem. Negative for facial swelling, sore throat and trouble swallowing.   Gastrointestinal: Negative for nausea and vomiting.  Musculoskeletal: Negative for neck pain.   Allergies  Amoxicillin  Home Medications   Prior to Admission medications   Medication Sig Start Date End Date Taking? Authorizing Provider  doxycycline (VIBRAMYCIN) 100 MG capsule Take 1 capsule (100 mg total) by mouth 2  (two) times daily. 11/12/14   Rosia Syme Patel-Mills, PA-C  HYDROcodone-acetaminophen (NORCO/VICODIN) 5-325 MG per tablet Take 2 tablets by mouth every 4 (four) hours as needed. 11/12/14   Jayliana Valencia Patel-Mills, PA-C  ibuprofen (ADVIL,MOTRIN) 200 MG tablet Take 200 mg by mouth every 6 (six) hours as needed for mild pain.    Historical Provider, MD  methocarbamol (ROBAXIN) 500 MG tablet Take 1 tablet (500 mg total) by mouth 2 (two) times daily. 06/20/13   Glynn Octave, MD  naproxen (NAPROSYN) 500 MG tablet Take 1 tablet (500 mg total) by mouth 2 (two) times daily. 06/20/13   Glynn Octave, MD  naproxen (NAPROSYN) 500 MG tablet Take 1 tablet (500 mg total) by mouth 2 (two) times daily as needed for mild pain, moderate pain or headache (TAKE WITH MEALS.). 08/24/14   Mercedes Camprubi-Soms, PA-C  traMADol (ULTRAM) 50 MG tablet Take 1 tablet (50 mg total) by mouth every 6 (six) hours as needed. 06/18/13   Renne Crigler, PA-C   BP 133/91 mmHg  Pulse 83  Temp(Src) 99 F (37.2 C) (Oral)  Resp 22  SpO2 100% Physical Exam  Constitutional: He is oriented to person, place, and time. He appears well-developed and well-nourished.  HENT:  Head: Normocephalic and atraumatic.  Mouth/Throat: Uvula is midline and oropharynx is clear and moist. No trismus in the jaw. Abnormal dentition. Dental caries present. No dental abscesses, uvula swelling or lacerations.  Dental caries throughout. Right upper molar TTP.  No facial swelling. No abscess palpated. No new tooth avulsion or fracture.   Eyes: Conjunctivae are normal.  Neck: Neck supple. No tracheal deviation present.  Cardiovascular: Normal rate.   Pulmonary/Chest: Effort normal. No respiratory distress.  Musculoskeletal: Normal range of motion.  Neurological: He is alert and oriented to person, place, and time.  Skin: Skin is warm and dry.  Psychiatric: He has a normal mood and affect.  Nursing note and vitals reviewed.   ED Course  Procedures   DIAGNOSTIC  STUDIES: Oxygen Saturation is 100% on RA, normal by my interpretation.    COORDINATION OF CARE: 5:36 PM Discussed treatment plan with pt at bedside and pt agreed to plan.   Labs Review Labs Reviewed - No data to display  Imaging Review No results found.   EKG Interpretation None      MDM   Final diagnoses:  Dental caries  Patient presents for chronic dental pain.  He has been seen multiple times in the ED for the same. He is afebrile and in no acute distress.  He has no palpable dental abscess but will treat in case this is the beginning of an infection.  Medications  HYDROcodone-acetaminophen (NORCO/VICODIN) 5-325 MG per tablet 2 tablet (2 tablets Oral Given 11/12/14 1751)  doxycycline (VIBRA-TABS) tablet 100 mg (100 mg Oral Given 11/12/14 1751)   I have given him 12 hydrocodone for pain and a doxycycline prescription since he is allergic to amoxicillin. He has been non compliant with antibiotics in the past. I gave him the resource guide to find a dentist and I also gave him an affordable dentist, [place]anna Civil contact information.  I personally performed the services described in this documentation, which was scribed in my presence. The recorded information has been reviewed and is accurate.      Catha GosselinHanna Patel-Mills, PA-C 11/12/14 2229  Gerhard Munchobert Lockwood, MD 11/12/14 [phone]

## 2014-11-12 NOTE — Discharge Instructions (Signed)
Dental Pain Take pain medication and anti-biotics as prescribed. Follow up with a dentist using the resource guide or with [facility]. A tooth ache may be caused by cavities (tooth decay). Cavities expose the nerve of the tooth to air and hot or cold temperatures. It may come from an infection or abscess (also called a boil or furuncle) around your tooth. It is also often caused by dental caries (tooth decay). This causes the pain you are having. DIAGNOSIS  Your caregiver can diagnose this problem by exam. TREATMENT   If caused by an infection, it may be treated with medications which kill germs (antibiotics) and pain medications as prescribed by your caregiver. Take medications as directed.  Only take over-the-counter or prescription medicines for pain, discomfort, or fever as directed by your caregiver.  Whether the tooth ache today is caused by infection or dental disease, you should see your dentist as soon as possible for further care. SEEK MEDICAL CARE IF: The exam and treatment you received today has been provided on an emergency basis only. This is not a substitute for complete medical or dental care. If your problem worsens or new problems (symptoms) appear, and you are unable to meet with your dentist, call or return to this location. SEEK IMMEDIATE MEDICAL CARE IF:   You have a fever.  You develop redness and swelling of your face, jaw, or neck.  You are unable to open your mouth.  You have severe pain uncontrolled by pain medicine. MAKE SURE YOU:   Understand these instructions.  Will watch your condition.  Will get help right away if you are not doing well or get worse. Document Released: 05/25/2005 Document Revised: 08/17/2011 Document Reviewed: 01/11/2008 [facility] Patient Information 2015 [place], [place]. This information is not intended to replace advice given to you by your health care provider. Make sure you discuss any questions you have with your health care  provider.  Emergency Department Resource Guide 1) Find a Doctor and Pay Out of Pocket Although you won't have to find out who is covered by your insurance plan, it is a good idea to ask around and get recommendations. You will then need to call the office and see if the doctor you have chosen will accept you as a new patient and what types of options they offer for patients who are self-pay. Some doctors offer discounts or will set up payment plans for their patients who do not have insurance, but you will need to ask so you aren't surprised when you get to your appointment.  2) Contact Your Local Health Department Not all health departments have doctors that can see patients for sick visits, but many do, so it is worth a call to see if yours does. If you don't know where your local health department is, you can check in your phone book. The CDC also has a tool to help you locate your state's health department, and many state websites also have listings of all of their local health departments.  3) Find a Walk-in Clinic If your illness is not likely to be very severe or complicated, you may want to try a walk in clinic. These are popping up all over the country in pharmacies, drugstores, and shopping centers. They're usually staffed by nurse practitioners or physician assistants that have been trained to treat common illnesses and complaints. They're usually fairly quick and inexpensive. However, if you have serious medical issues or chronic medical problems, these are probably not your best option.  No Primary Care Doctor: - Call Health Connect at  [phone] - they can help you locate a primary care doctor that  accepts your insurance, provides certain services, etc. - Physician Referral Service- [phone]  Chronic Pain Problems: Organization         Address  Phone   Notes  [facility]  [phone] Patients need to be referred by their primary care doctor.   Medication  Assistance: Organization         Address  Phone   Notes  [facility]ication [facility] Program [address] [place], [place] [address] [phone] --Must be a resident of [place] -- Must have NO insurance coverage whatsoever (no Medicaid/ Medicare, etc.) -- The pt. MUST have a primary care doctor that directs their care regularly and follows them in the community   MedAssist  [phone]   [employer]  [phone]    Agencies that provide inexpensive medical care: Organization         Address  Phone   Notes  [facility]  [phone]   [facility]    [phone]   [facility] [address] [place], [place] [address] [phone]   [facility] [address], [place] [phone]) [phone]   Planned Parenthood    [phone](336) [phone]   [facility]    [phone]   [facility]  [address], [place] Phone:  [phone], Fax:  [phone] Hours of Operation:  9 am - 6 pm, M-F.  Also accepts Medicaid/Medicare and self-pay.  [facility]  [address], [place] Phone: [phone], Fax: [phone]) [phone]. Hours of Operation:  8:30 am - 5:30 pm, M-F.  Also accepts Medicaid and self-pay.  [facility] [place] [address], [place] Phone: [phone]) [phone]   [facility] [address], [place], [place] [phone], Ext. [phone] Mondays & Thursdays: 7-9 AM.  First 15 patients are seen on a first come, first serve basis.    Medicaid-accepting [facility] Providers:  Organization         Address  Phone   Notes  [facility] [address], [place] [phone] Also accepts self-pay patients.  [facility] [address], [place]  [phone]) [phone]   [facility] [address], [place]  [phone]   [facility] [address] [place] [address], [place] [phone]) [phone]   Renaye RakersVeita Bland [address], [place]   [phone] Only accepts [place] Access [place] patients after they have their name applied to their card.   Self-Pay (no insurance) in [facility] [place]:  Organization         Address  Phone   Notes  Sickle Cell Patients, [facility] [address], [place] [phone]) [phone]   [facility] [address], [place] [phone]) [phone]   [facility] [place]  [address], [place] [phone]   [facility]/Dr. Osei-Bonsu  [address] [place] [address], [place] or [id number], [place] [phone] Phone number for both [place] and [place] locations is the same.  [facility] [address], [place] [phone]  [facility] [address] [place] or [address] [phone]   [facility] [place], [place] [phone], phone; [phone], fax Sees patients 1st and 3rd Saturday of every month.  Must not qualify for public or private insurance (i.e. Medicaid, Medicare, [place] Health Choice, Veterans' Benefits)  Household income should be no more than 200% of the poverty level The clinic cannot treat you if you are pregnant or think you are pregnant  Sexually transmitted diseases are not treated at the clinic.    Dental Care: Organization         Address  Phone  Notes  [facility] of [facility] [place] [phone] Accepts children up to age 67 who are enrolled in [place] or [place]; pregnant women with a Medicaid card; and children who have applied for Medicaid or [place] Health Choice, but were declined, whose parents can pay a reduced fee at time of service.  [facility]  Department of [facility]  [address], [place] [phone] Accepts children up to age 57 who are enrolled in [place] or [place]; pregnant women with a Medicaid card; and children who have applied for Medicaid or [place] Health Choice, but were declined, whose parents can pay a reduced fee at time of service.  [place] Adult [facility] Access PROGRAM  [place] [phone] Patients are seen by appointment only. Walk-ins are not accepted. [place] will see patients 41 years of age and older. Monday - Tuesday (8am-5pm) Most Wednesdays (8:30-5pm) $30 per visit, cash only  [facility] Adult [facility] Access PROGRAM  [address], [facility] [phone] Patients are seen by appointment only. Walk-ins are not accepted. [place] will see patients 73 years of age and older. One Wednesday Evening (Monthly: Volunteer Based).  $30 per visit, cash only  [place]  [phone] for adults; Children under age 55, call Graduate Pediatric Dentistry at [phone]. Children aged 1-14, please call [phone] to request a pediatric application.  Dental services are provided in all areas of dental care including fillings, crowns and bridges, complete and partial dentures, implants, gum treatment, root canals, and extractions. Preventive care is also provided. Treatment is provided to both adults and children. Patients are selected via a lottery and there is often a waiting list.   [facility] [address], [place]  [phone] [email or web address]   [facility] [address] [place], [place] [phone], Ext. [phone] Second and Fourth Thursday of each month, opens at 6:30 AM; Clinic ends at 9 AM.  Patients are seen on a first-come first-served basis, and a limited number are seen during each clinic.   [facility]  [address] Hillard Danker [place], [place] [phone]    Eligibility Requirements You must have lived in [place], [place], or [place] counties for at least the last three months.   You cannot be eligible for state or federal sponsored [employer], including [employer], [place], or [employer].   You generally cannot be eligible for healthcare insurance through your employer.    How to apply: Eligibility screenings are held every Tuesday and Wednesday afternoon from 1:00 pm until 4:00 pm. You do not need an appointment for the interview!  [facility] [address], [place], [place]  [place]  [place] Department  [place]  [phone]    Behavioral Health Resources in the Community: Intensive Outpatient Programs Organization         Address  Phone  Notes  [place]. [address] [place], [place] [phone]   [facility] [address], [place], [place]   ADS: Alcohol [facility] Drug S[facility]cs [address] [place], [place]   [place] [address],  [place], [place] or [phone]   Substance Abuse Resources Organization         Address  Phone  Notes  Alcohol and Drug Services  [phone]   [place]  [phone]   [facility] [place]   Chinita Pester  [phone]   Residential & Outpatient Substance Abuse Program  [phone]   Psychological Services Organization         Address  Phone  Notes  [facility] [place]  [place]  [phone]   [place] [address], [place] or [phone]    Mobile Crisis Teams Organization         Address  Phone  Notes  [facility], Mobile Crisis Care Unit  [phone]   [facility]  [address]  [place], [place]   [place] Levels [address] [place] [phone]    Self-Help/Support Groups Organization         Address  Phone             Notes  [place]. of [place] - variety of support groups  [place] Call for more information  Narcotics Anonymous (NA), [facility] [address], [employer] [place]  2 meetings at this location   Special educational needs teacher         Address  Phone  Notes  [facility]AP Residential Treatment [place],    [place]  [phone]   [facility]  [address] [place] 462863, [place], [place]   [place], [place] [phone] Admissions: 8am-3pm M-F  Incentives Substance [place] [address],    [place], [place] [phone]   [facility] [address] [place], [place], [place]   [facility] [address]  [place], [place]   Insight Programs - Intensive Outpatient [address], [place], [place]   [facility] ([place].) [place].,  [place], [place] [phone] or [phone]   [facility]idential Treatment Services (RTS) [address] [place], [place] Accepts Medicaid  [facility] [place] [address]  [place] [phone] Substance Abuse/Addiction Treatment   [facility] Organization         Address  Phone  Notes  [facility]  [phone]   Domenic Schwab, PhD [address] [place] [address] [place]lis [place], [place]   [phone] or [phone]   [place] [address] [place], [place] [phone]   [place] [address], [place], [place] [phone] Insurance/Medicaid/sponsorship through [employer] and Families [address], EYE [address] [place], [place] [phone] [place], [place] [phone]    Dr. Adele Schilder  [phone]   [facility] Dept. 1) [address] [place] 2) [place] 3)  [place] [address], [place] [phone]  [phone]   [place] [phone] or [phone] (After Hours)

## 2014-11-29 ENCOUNTER — Emergency Department (HOSPITAL_COMMUNITY)
Admission: EM | Admit: 2014-11-29 | Discharge: 2014-11-29 | Discharge status: Against Medical Advice | Payer: Medicaid Other | Attending: Emergency Medicine | Admitting: Emergency Medicine

## 2014-11-29 ENCOUNTER — Encounter (HOSPITAL_COMMUNITY): Payer: Self-pay | Admitting: Emergency Medicine

## 2014-11-29 DIAGNOSIS — K088 Other specified disorders of teeth and supporting structures: Secondary | ICD-10-CM | POA: Insufficient documentation

## 2014-11-29 DIAGNOSIS — Z720 Tobacco use: Secondary | ICD-10-CM | POA: Diagnosis not present

## 2014-11-29 MED ORDER — LIDOCAINE VISCOUS 2 % MT SOLN
15.0000 mL | Freq: Once | OROMUCOSAL | Status: DC
  Filled 2014-11-29: qty 15

## 2014-11-29 NOTE — ED Notes (Addendum)
Pt from home with dental pain x 2 weeks. Pt states he was here about 2 weeks ago for an abscess in the same spot. He was prescribed antibiotics, but when he finished taking them, the infection came back. Pain is in right bottom jaw.

## 2014-11-29 NOTE — ED Provider Notes (Signed)
Patient disgruntled over xylocaine order prior to full assessment and not receiving narcotic pain medication. Left prior to being seen. Verbally abusive to nursing staff prior to walking out of ED.   Francee Piccolo, PA-C 11/29/14 2039  Pricilla Loveless, MD 12/04/14 [phone]

## 2014-11-29 NOTE — ED Notes (Signed)
Upon entering the room, patient's mother says, "are y'all not going to give him something for pain?" after being told that he was receiving Lidocaine mouth solution. Patient's mother was then told that per new emergency guidelines this hospital does not prescribe narcotics for dental pain. Mother then proceeds to raise her voice at this writer saying, "I can't believe y'all would do that to him. He's in pain not drug seeking." Reiterated to mother these guidelines were not our decision. Patient says, "Well fuck it, I'm leaving then." Visualized walking out of ER.

## 2015-01-01 ENCOUNTER — Emergency Department (HOSPITAL_COMMUNITY)
Admission: EM | Admit: 2015-01-01 | Discharge: 2015-01-01 | Disposition: A | Discharge status: Home / Self Care | Payer: Medicaid Other | Attending: Emergency Medicine | Admitting: Emergency Medicine

## 2015-01-01 ENCOUNTER — Encounter (HOSPITAL_COMMUNITY): Payer: Self-pay | Admitting: Emergency Medicine

## 2015-01-01 DIAGNOSIS — Z880 Allergy status to penicillin: Secondary | ICD-10-CM | POA: Insufficient documentation

## 2015-01-01 DIAGNOSIS — Y998 Other external cause status: Secondary | ICD-10-CM | POA: Insufficient documentation

## 2015-01-01 DIAGNOSIS — Z792 Long term (current) use of antibiotics: Secondary | ICD-10-CM | POA: Insufficient documentation

## 2015-01-01 DIAGNOSIS — Y9389 Activity, other specified: Secondary | ICD-10-CM | POA: Insufficient documentation

## 2015-01-01 DIAGNOSIS — Y288XXA Contact with other sharp object, undetermined intent, initial encounter: Secondary | ICD-10-CM | POA: Insufficient documentation

## 2015-01-01 DIAGNOSIS — Y929 Unspecified place or not applicable: Secondary | ICD-10-CM | POA: Insufficient documentation

## 2015-01-01 DIAGNOSIS — S61411A Laceration without foreign body of right hand, initial encounter: Secondary | ICD-10-CM | POA: Insufficient documentation

## 2015-01-01 DIAGNOSIS — Z791 Long term (current) use of non-steroidal anti-inflammatories (NSAID): Secondary | ICD-10-CM | POA: Insufficient documentation

## 2015-01-01 DIAGNOSIS — Z8719 Personal history of other diseases of the digestive system: Secondary | ICD-10-CM | POA: Insufficient documentation

## 2015-01-01 DIAGNOSIS — Z720 Tobacco use: Secondary | ICD-10-CM | POA: Insufficient documentation
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
## 2015-01-01 NOTE — Discharge Instructions (Signed)
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
## 2015-01-01 NOTE — ED Notes (Signed)
EDPA into room, deputy remains at [facility].

## 2015-01-01 NOTE — ED Notes (Signed)
"  feel better wrapped", NAD, calm, ambulatory, steady gait, denies needs or questions, out with LEO. Coban and ACE wrap dressing in place.
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
## 2015-03-17 ENCOUNTER — Emergency Department (HOSPITAL_COMMUNITY): Payer: Medicaid Other

## 2015-03-17 ENCOUNTER — Encounter (HOSPITAL_COMMUNITY): Payer: Self-pay | Admitting: Emergency Medicine

## 2015-03-17 ENCOUNTER — Emergency Department (HOSPITAL_COMMUNITY)
Admission: EM | Admit: 2015-03-17 | Discharge: 2015-03-17 | Disposition: A | Discharge status: Home / Self Care | Payer: Self-pay | Attending: Emergency Medicine | Admitting: Emergency Medicine

## 2015-03-17 DIAGNOSIS — Y9389 Activity, other specified: Secondary | ICD-10-CM | POA: Insufficient documentation

## 2015-03-17 DIAGNOSIS — S61012A Laceration without foreign body of left thumb without damage to nail, initial encounter: Secondary | ICD-10-CM | POA: Insufficient documentation

## 2015-03-17 DIAGNOSIS — Y9289 Other specified places as the place of occurrence of the external cause: Secondary | ICD-10-CM | POA: Insufficient documentation

## 2015-03-17 DIAGNOSIS — Z880 Allergy status to penicillin: Secondary | ICD-10-CM | POA: Insufficient documentation

## 2015-03-17 DIAGNOSIS — Y990 Civilian activity done for income or pay: Secondary | ICD-10-CM | POA: Insufficient documentation

## 2015-03-17 DIAGNOSIS — Z720 Tobacco use: Secondary | ICD-10-CM | POA: Insufficient documentation

## 2015-03-17 DIAGNOSIS — W312XXA Contact with powered woodworking and forming machines, initial encounter: Secondary | ICD-10-CM | POA: Insufficient documentation

## 2015-03-17 DIAGNOSIS — Z8719 Personal history of other diseases of the digestive system: Secondary | ICD-10-CM | POA: Insufficient documentation

## 2015-03-17 DIAGNOSIS — IMO0002 Reserved for concepts with insufficient information to code with codable children: Secondary | ICD-10-CM

## 2015-03-17 MED ORDER — CEPHALEXIN 500 MG PO CAPS: 500.0000 mg | ORAL_CAPSULE | Freq: Three times a day (TID) | ORAL | Status: DC

## 2015-03-17 MED ORDER — OXYCODONE-ACETAMINOPHEN 5-325 MG PO TABS
2.0000 | ORAL_TABLET | Freq: Once | ORAL | Status: AC
  Administered 2015-03-17: 2 via ORAL
  Filled 2015-03-17: qty 2

## 2015-03-17 MED ORDER — OXYCODONE-ACETAMINOPHEN 5-325 MG PO TABS: 1.0000 | ORAL_TABLET | Freq: Three times a day (TID) | ORAL | Status: DC | PRN

## 2015-03-17 MED ORDER — HYDROMORPHONE HCL 1 MG/ML IJ SOLN
1.0000 mg | Freq: Once | INTRAMUSCULAR | Status: DC
Start: 2015-03-17 — End: 2015-03-17

## 2015-03-17 MED ORDER — HYDROMORPHONE HCL 1 MG/ML IJ SOLN
1.0000 mg | Freq: Once | INTRAMUSCULAR | Status: AC
  Administered 2015-03-17: 1 mg via INTRAMUSCULAR
  Filled 2015-03-17: qty 1

## 2015-03-17 MED ORDER — TETANUS-DIPHTH-ACELL PERTUSSIS 5-2.5-18.5 LF-MCG/0.5 IM SUSP
0.5000 mL | Freq: Once | INTRAMUSCULAR | Status: AC
  Administered 2015-03-17: 0.5 mL via INTRAMUSCULAR
  Filled 2015-03-17: qty 0.5

## 2015-03-17 MED ORDER — CEPHALEXIN 250 MG PO CAPS
500.0000 mg | ORAL_CAPSULE | Freq: Once | ORAL | Status: AC
  Administered 2015-03-17: 500 mg via ORAL
  Filled 2015-03-17: qty 2

## 2015-03-17 MED ORDER — LIDOCAINE HCL (PF) 1 % IJ SOLN
5.0000 mL | Freq: Once | INTRAMUSCULAR | Status: AC
  Administered 2015-03-17: 5 mL
  Filled 2015-03-17: qty 5

## 2015-03-17 NOTE — ED Provider Notes (Signed)
CSN: [id number]     Arrival date & time 03/17/15  1338 History   First MD Initiated Contact with Patient 03/17/15 1345     Chief Complaint  Patient presents with  . Laceration     (Consider location/radiation/quality/duration/timing/severity/associated sxs/prior Treatment) Patient is a 24 y.o. male presenting with skin laceration.  Laceration Location:  Finger Finger laceration location:  L thumb Length (cm):  2 Depth:  Cutaneous Bleeding: controlled   Time since incident:  30 minutes Pain details:    Quality:  Aching   Severity:  Mild   Timing:  Constant Foreign body present:  No foreign bodies Ineffective treatments:  Certain positions and pressure Tetanus status:  Unknown   Past Medical History  Diagnosis Date  . Dental caries    History reviewed. No pertinent past surgical history. No family history on file. Social History  Substance Use Topics  . Smoking status: Current Every Day Smoker -- 0.50 packs/day    Types: Cigarettes  . Smokeless tobacco: None  . Alcohol Use: 1.2 oz/week    2 Shots of liquor per week     Comment: plenty     Review of Systems  Cardiovascular: Negative for chest pain.  Gastrointestinal: Negative for vomiting and diarrhea.  Musculoskeletal: Negative for back pain and neck pain.  Skin: Positive for wound (to left thumb).  All other systems reviewed and are negative.     Allergies  Amoxicillin  Home Medications   Prior to Admission medications   Medication Sig Start Date End Date Taking? Authorizing Provider  doxycycline (VIBRAMYCIN) 100 MG capsule Take 1 capsule (100 mg total) by mouth 2 (two) times daily. Patient not taking: Reported on 01/01/2015 11/12/14   Catha Gosselin, PA-C  HYDROcodone-acetaminophen (NORCO/VICODIN) 5-325 MG per tablet Take 2 tablets by mouth every 4 (four) hours as needed. Patient not taking: Reported on 01/01/2015 11/12/14   Catha Gosselin, PA-C  ibuprofen (ADVIL,MOTRIN) 200 MG tablet Take 200 mg by  mouth every 6 (six) hours as needed for mild pain.    Historical Provider, MD  naproxen (NAPROSYN) 500 MG tablet Take 1 tablet (500 mg total) by mouth 2 (two) times daily as needed for mild pain, moderate pain or headache (TAKE WITH MEALS.). Patient not taking: Reported on 01/01/2015 08/24/14   Mercedes Camprubi-Soms, PA-C   BP 142/82 mmHg  Pulse 109  Temp(Src) 98.9 F (37.2 C) (Oral)  Resp 20  SpO2 100% Physical Exam  Constitutional: He is oriented to person, place, and time. He appears well-developed and well-nourished.  HENT:  Head: Normocephalic and atraumatic.  Eyes: Conjunctivae and EOM are normal.  Neck: Normal range of motion. Neck supple.  Cardiovascular: Normal rate and regular rhythm.   Pulmonary/Chest: Effort normal. No respiratory distress.  Abdominal: Soft. There is no tenderness.  Musculoskeletal: Normal range of motion. He exhibits no edema or tenderness.  Neurological: He is alert and oriented to person, place, and time.  Skin: Skin is warm and dry.  Small jagged laceration to left thumb. NVI distally aside from decreased sensation to medial thumb.   Nursing note and vitals reviewed.   ED Course  .Marland KitchenLaceration Repair Date/Time: 03/20/2015 10:22 PM Performed by: Marily Memos Authorized by: Marily Memos Consent: Verbal consent obtained. Risks and benefits: risks, benefits and alternatives were discussed Consent given by: patient Body area: upper extremity Location details: left thumb Laceration length: 3 cm Tendon involvement: none Nerve involvement: none Vascular damage: no Anesthesia: digital block Local anesthetic: lidocaine 1% without epinephrine Anesthetic total: 2  ml Preparation: Patient was prepped and draped in the usual sterile fashion. Irrigation solution: saline Irrigation method: syringe Amount of cleaning: extensive Debridement: none Degree of undermining: none Skin closure: 4-0 Prolene and Steri-Strips Number of sutures: 5 Technique:  complex and simple Approximation: close Approximation difficulty: complex Dressing: gauze packing Patient tolerance: Patient tolerated the procedure well with no immediate complications   (including critical care time) Labs Review Labs Reviewed - No data to display  Imaging Review No results found. I have personally reviewed and evaluated these images and lab results as part of my medical decision-making.   EKG Interpretation None      MDM   Final diagnoses:  Laceration   Laceration left thumb from skil saw. NVI. unk last tetanus. No other injuries. Bleeding controlled. Will xr, abx, tdap, pain meds.   Lac repaired as above. No obvious foreign bodies as mentioned in XR however was irrigated liberally. Will start on abx in case it was a fracture. Discussed digital nerve injury and he may or may not recover, and reasons to return to ED, otherwise will return in 5-7 days for wound check and suture remova.   I have personally and contemperaneously reviewed labs and imaging and used in my decision making as above.   A medical screening exam was performed and I feel the patient has had an appropriate workup for their chief complaint at this time and likelihood of emergent condition existing is low. They have been counseled on decision, discharge, follow up and which symptoms necessitate immediate return to the emergency department. They or their family verbally stated understanding and agreement with plan and discharged in stable condition.      Marily Memos, MD 03/20/15 2223

## 2015-03-17 NOTE — ED Notes (Addendum)
Pt given [place] bag and drink

## 2015-03-17 NOTE — ED Notes (Signed)
Called ortho for [place] finger splint

## 2015-03-17 NOTE — Progress Notes (Signed)
Orthopedic Tech Progress Note Patient Details:  Matthew Reyes 08/14/1990 [id number]  Ortho Devices Type of Ortho Device: Finger splint Ortho Device/Splint Interventions: Application   Saul Fordyce 03/17/2015, 4:54 PM
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
## 2015-03-17 NOTE — ED Notes (Signed)
MD at bedside. 

## 2015-03-17 NOTE — ED Notes (Signed)
Ortho is coming to apply splint

## 2015-03-19 ENCOUNTER — Encounter (HOSPITAL_COMMUNITY): Payer: Self-pay | Admitting: Emergency Medicine

## 2015-03-19 ENCOUNTER — Emergency Department (HOSPITAL_COMMUNITY)
Admission: EM | Admit: 2015-03-19 | Discharge: 2015-03-19 | Disposition: A | Discharge status: Home / Self Care | Payer: Medicaid Other | Attending: Emergency Medicine | Admitting: Emergency Medicine

## 2015-03-19 DIAGNOSIS — M79645 Pain in left finger(s): Secondary | ICD-10-CM | POA: Insufficient documentation

## 2015-03-19 DIAGNOSIS — Z8719 Personal history of other diseases of the digestive system: Secondary | ICD-10-CM | POA: Insufficient documentation

## 2015-03-19 DIAGNOSIS — Z880 Allergy status to penicillin: Secondary | ICD-10-CM | POA: Insufficient documentation

## 2015-03-19 DIAGNOSIS — Z720 Tobacco use: Secondary | ICD-10-CM | POA: Insufficient documentation

## 2015-03-19 DIAGNOSIS — Z4801 Encounter for change or removal of surgical wound dressing: Secondary | ICD-10-CM | POA: Insufficient documentation

## 2015-03-19 DIAGNOSIS — Z5189 Encounter for other specified aftercare: Secondary | ICD-10-CM

## 2015-03-19 DIAGNOSIS — R52 Pain, unspecified: Secondary | ICD-10-CM

## 2015-03-19 LAB — ACETAMINOPHEN LEVEL

## 2015-03-19 MED ORDER — MORPHINE SULFATE (PF) 4 MG/ML IV SOLN: 4.0000 mg | Freq: Once | INTRAVENOUS | Status: DC

## 2015-03-19 MED ORDER — ONDANSETRON 4 MG PO TBDP: 4.0000 mg | ORAL_TABLET | Freq: Three times a day (TID) | ORAL | Status: DC | PRN

## 2015-03-19 MED ORDER — MORPHINE SULFATE (PF) 4 MG/ML IV SOLN
4.0000 mg | Freq: Once | INTRAVENOUS | Status: AC
  Administered 2015-03-19: 4 mg via INTRAMUSCULAR
  Filled 2015-03-19: qty 1

## 2015-03-19 MED ORDER — TRAMADOL HCL 50 MG PO TABS: 50.0000 mg | ORAL_TABLET | Freq: Four times a day (QID) | ORAL | Status: DC | PRN

## 2015-03-19 MED ORDER — ONDANSETRON HCL 4 MG/2ML IJ SOLN
4.0000 mg | Freq: Once | INTRAMUSCULAR | Status: AC
  Administered 2015-03-19: 4 mg via INTRAMUSCULAR
  Filled 2015-03-19: qty 2

## 2015-03-19 NOTE — ED Notes (Signed)
Pt given cup of water 

## 2015-03-19 NOTE — Discharge Instructions (Signed)
Wound Check  If you have a wound, it may take some time to heal. Eventually, a scar will form. The scar will also fade with time. It is important to take care of your wound while it is healing. This helps to protect your wound from infection.   HOW SHOULD I TAKE CARE OF MY WOUND AT HOME?   Some wounds are allowed to close on their own or are repaired at a later date. There are many different ways to close and cover a wound, including stitches (sutures), skin glue, and adhesive strips. Follow your health care provider's instructions about:    Wound care.    Bandage (dressing) changes and removal.    Wound closure removal.   Take medicines only as directed by your health care provider.   Keep all follow-up visits as directed by your health care provider. This is important.   Do not take baths, swim, or use a hot tub until your health care provider approves. You may shower as directed by your health care provider.   Keep your wound clean and dry.  WHAT AFFECTS SCAR FORMATION?  Scars affect each person differently. How your body scars depends on:   The location and size of your wound.   Traits that you inherited from your parents (genetic predisposition).   How you take care of your wound. Irritation and inflammation increase the amount of scar formation.   Sun exposure. This can darken a scar.  WHEN SHOULD I CALL OR SEE MY HEALTH CARE PROVIDER?  Call or see your health care provider if:   You have redness, swelling, or pain at your wound site.   You have fluid, blood, or pus coming from your wound.   You have muscle aches, chills, or a general ill feeling.   You notice a bad smell coming from the wound.   Your wound separates after the sutures, staples, or skin adhesive strips have been removed.   You have persistent nausea or vomiting.   You have a fever.   You are dizzy.  WHEN SHOULD I CALL 911 OR GO TO THE EMERGENCY ROOM?  Call 911 or go to the emergency room if:   You faint.   You have difficulty  breathing.     This information is not intended to replace advice given to you by your health care provider. Make sure you discuss any questions you have with your health care provider.     Document Released: 02/29/2004 Document Revised: 06/15/2014 Document Reviewed: 03/06/2014  Elsevier Interactive Patient Education 2016 [employer]

## 2015-03-19 NOTE — ED Provider Notes (Signed)
CSN: [id number]     Arrival date & time 03/19/15  0231 History  By signing my name below, I, Lyndel Safe, attest that this documentation has been prepared under the direction and in the presence of Shon Baton, MD. Electronically Signed: Lyndel Safe, ED Scribe. 03/19/2015. 3:13 AM.   Chief Complaint  Patient presents with  . Emesis   HPI HPI Comments: Matthew Reyes is a 24 y.o. male who presents to the Emergency Department complaining of nausea with emesis after taking prescription Percocet and Keflex s/p laceration repair to left thumb 2 days ago. He states he has been unable to relieve his pain of left thumb with percocet. Pt reports he has taken 13 of the 20 percocet prescribed 2 days ago. Denies abdominal pain.     Past Medical History  Diagnosis Date  . Dental caries    History reviewed. No pertinent past surgical history. No family history on file. Social History  Substance Use Topics  . Smoking status: Current Every Day Smoker -- 0.00 packs/day    Types: Cigarettes  . Smokeless tobacco: None  . Alcohol Use: Yes    Review of Systems  Musculoskeletal:       Left thumb pain  Skin: Positive for wound.  All other systems reviewed and are negative.  Allergies  Amoxicillin  Home Medications   Prior to Admission medications   Medication Sig Start Date End Date Taking? Authorizing Provider  cephALEXin (KEFLEX) 500 MG capsule Take 1 capsule (500 mg total) by mouth 3 (three) times daily. 03/17/15  Yes Marily Memos, MD  ibuprofen (ADVIL,MOTRIN) 200 MG tablet Take 200 mg by mouth every 6 (six) hours as needed for mild pain.   Yes Historical Provider, MD  oxyCODONE-acetaminophen (PERCOCET/ROXICET) 5-325 MG tablet Take 1-2 tablets by mouth every 8 (eight) hours as needed for severe pain. 03/17/15  Yes Marily Memos, MD  ondansetron (ZOFRAN ODT) 4 MG disintegrating tablet Take 1 tablet (4 mg total) by mouth every 8 (eight) hours as needed for nausea or vomiting.  03/19/15   Shon Baton, MD  traMADol (ULTRAM) 50 MG tablet Take 1 tablet (50 mg total) by mouth every 6 (six) hours as needed. 03/19/15   Shon Baton, MD   BP 134/68 mmHg  Pulse 84  Temp(Src) 97.3 F (36.3 C) (Oral)  Resp 16  Ht  (1.727 m)  Wt 132 lb (59.875 kg)  BMI 20.08 kg/m2  SpO2 98% Physical Exam  Constitutional: He is oriented to person, place, and time.  Disheveled, no acute distress  HENT:  Head: Normocephalic and atraumatic.  Cardiovascular: Normal rate and regular rhythm.   Pulmonary/Chest: Effort normal. No respiratory distress.  Abdominal: Soft. There is no tenderness.  Musculoskeletal: He exhibits no edema.  Focused examination of the left thumb reveals sutures in place over the lateral aspect of the nail, no longer in a splint, neurovascularly intact, no drainage, 2+ radial pulse  Neurological: He is alert and oriented to person, place, and time.  Skin: Skin is warm and dry.  Psychiatric: He has a normal mood and affect.  Nursing note and vitals reviewed.   ED Course  Procedures  DIAGNOSTIC STUDIES: Oxygen Saturation is 98% on RA, normal by my interpretation.    COORDINATION OF CARE: 3:01 AM Discussed treatment plan with pt at bedside and pt agreed to plan. Will order morphine and Zofran.   Imaging Review Dg Finger Thumb Left  03/17/2015   CLINICAL DATA:  Left thumb got  caught in saw yesterday with laceration.  EXAM: LEFT THUMB 2+V  COMPARISON:  None.  FINDINGS: There is seen some irregularity involving the dorsal aspect of the proximal base of the first distal phalanx which may represent fracture. High density material is noted in the soft tissues dorsal to the first interphalangeal joint which may represent bone fragments or possibly foreign body or debris. Soft tissue laceration is noted in this area.  IMPRESSION: Some irregularity involving the dorsal aspect of the proximal base of the first distal phalanx which may suggest small fracture.  High density material is noted in soft tissues dorsal to first interphalangeal joint which may represent bone fragments or possibly foreign body or debris.   Electronically Signed   By: Lupita Raider, M.D.   On: 03/17/2015 15:22   I have personally reviewed and evaluated these images results as part of my medical decision-making.   MDM   Final diagnoses:  Pain  Visit for wound check    Patient presents with emesis and persistent pain. He relates this to ineffective Percocet. He feels that his vomiting is related to the Percocet as well. He reports that he is taken 13 of 20 Percocet tablets. This is just over 4 g and 36 hours. Per the patient's family, he may have taken all 20. Tylenol level obtained and is negative. Wound is clean dry and intact. No obvious infection. Patient's hand was reportedly placed in a splint but no splint present on presentation. Wound was redressed. Patient was given IM Zofran and morphine. He was comfortable and rested his entire stay. Will change patient to tramadol. Per patient's family, he has a history of alcohol abuse. Will limit tramadol to 10 tablets. Follow-up for suture removal as previously arranged.  After history, exam, and medical workup I feel the patient has been appropriately medically screened and is safe for discharge home. Pertinent diagnoses were discussed with the patient. Patient was given return precautions.   I personally performed the services described in this documentation, which was scribed in my presence. The recorded information has been reviewed and is accurate.    Shon Baton, MD 03/19/15 [phone]

## 2015-03-19 NOTE — ED Notes (Signed)
Pt. reports persistent emesis after taking prescription Percocet and Keflex for his left thumb laceration 2 days ago .

## 2015-03-19 NOTE — ED Notes (Signed)
Pt left with all his belongings and ambulated out of treatment area.  

## 2015-06-07 ENCOUNTER — Encounter (HOSPITAL_BASED_OUTPATIENT_CLINIC_OR_DEPARTMENT_OTHER): Payer: Self-pay | Admitting: *Deleted

## 2015-06-07 ENCOUNTER — Emergency Department (HOSPITAL_BASED_OUTPATIENT_CLINIC_OR_DEPARTMENT_OTHER)
Admission: EM | Admit: 2015-06-07 | Discharge: 2015-06-07 | Disposition: A | Discharge status: Home / Self Care | Payer: Medicaid Other | Attending: Emergency Medicine | Admitting: Emergency Medicine

## 2015-06-07 DIAGNOSIS — Z792 Long term (current) use of antibiotics: Secondary | ICD-10-CM | POA: Insufficient documentation

## 2015-06-07 DIAGNOSIS — Z880 Allergy status to penicillin: Secondary | ICD-10-CM | POA: Insufficient documentation

## 2015-06-07 DIAGNOSIS — K029 Dental caries, unspecified: Secondary | ICD-10-CM | POA: Insufficient documentation

## 2015-06-07 DIAGNOSIS — F17210 Nicotine dependence, cigarettes, uncomplicated: Secondary | ICD-10-CM | POA: Insufficient documentation

## 2015-06-07 DIAGNOSIS — K0889 Other specified disorders of teeth and supporting structures: Secondary | ICD-10-CM | POA: Insufficient documentation

## 2015-06-07 MED ORDER — IBUPROFEN 200 MG PO TABS
ORAL_TABLET | ORAL | Status: AC
Start: 2015-06-07 — End: 2015-06-07
  Filled 2015-06-07: qty 1

## 2015-06-07 MED ORDER — ACETAMINOPHEN 325 MG PO TABS
650.0000 mg | ORAL_TABLET | Freq: Once | ORAL | Status: AC
  Administered 2015-06-07: 650 mg via ORAL

## 2015-06-07 MED ORDER — ACETAMINOPHEN 325 MG PO TABS
ORAL_TABLET | ORAL | Status: AC
  Administered 2015-06-07: 650 mg via ORAL
  Filled 2015-06-07: qty 2

## 2015-06-07 MED ORDER — DOXYCYCLINE HYCLATE 100 MG PO CAPS: 100.0000 mg | ORAL_CAPSULE | Freq: Two times a day (BID) | ORAL | Status: DC

## 2015-06-07 MED ORDER — IBUPROFEN 400 MG PO TABS
ORAL_TABLET | ORAL | Status: AC
  Filled 2015-06-07: qty 1

## 2015-06-07 MED ORDER — IBUPROFEN 200 MG PO TABS
600.0000 mg | ORAL_TABLET | Freq: Once | ORAL | Status: AC
  Administered 2015-06-07: 600 mg via ORAL

## 2015-06-07 MED ORDER — HYDROCODONE-ACETAMINOPHEN 5-325 MG PO TABS
1.0000 | ORAL_TABLET | Freq: Once | ORAL | Status: DC
  Filled 2015-06-07: qty 1

## 2015-06-07 NOTE — ED Notes (Signed)
[facility]

## 2015-06-07 NOTE — Discharge Instructions (Signed)
Schedule an appointment with a dentist for a visit. Take OTC tylenol or ibuprofen for pain control.   Dental Pain    Dental pain may be caused by many things, including:  Tooth decay (cavities or caries). Cavities expose the nerve of your tooth to air and hot or cold temperatures. This can cause pain or discomfort.  Abscess or infection. A dental abscess is a collection of infected pus from a bacterial infection in the inner part of the tooth (pulp). It usually occurs at the end of the tooth's root.  Injury.  An unknown reason (idiopathic). Your pain may be mild or severe. It may only occur when:  You are chewing.  You are exposed to hot or cold temperature.  You are eating or drinking sugary foods or beverages, such as soda or candy. Your pain may also be constant.  HOME CARE INSTRUCTIONS  Watch your dental pain for any changes. The following actions may help to lessen any discomfort that you are feeling:  Take medicines only as directed by your dentist.  If you were prescribed an antibiotic medicine, finish all of it even if you start to feel better.  Keep all follow-up visits as directed by your dentist. This is important.  Do not apply heat to the outside of your face.  Rinse your mouth or gargle with salt water if directed by your dentist. This helps with pain and swelling.  You can make salt water by adding  tsp of salt to 1 cup of warm water. Apply ice to the painful area of your face:  Put ice in a plastic bag.  Place a towel between your skin and the bag.  Leave the ice on for 20 minutes, 2-3 times per day. Avoid foods or drinks that cause you pain, such as:  Very hot or very cold foods or drinks.  Sweet or sugary foods or drinks. SEEK MEDICAL CARE IF:  Your pain is not controlled with medicines.  Your symptoms are worse.  You have new symptoms. SEEK IMMEDIATE MEDICAL CARE IF:  You are unable to open your mouth.  You are having trouble breathing or swallowing.  You have  a fever.  Your face, neck, or jaw is swollen. This information is not intended to replace advice given to you by your health care provider. Make sure you discuss any questions you have with your health care provider.  Document Released: 05/25/2005 Document Revised: 10/09/2014 Document Reviewed: 05/21/2014  Elsevier Interactive Patient Education 2016 [employer].     Emergency Department Resource Guide 1) Find a Doctor and Pay Out of Pocket Although you won't have to find out who is covered by your insurance plan, it is a good idea to ask around and get recommendations. You will then need to call the office and see if the doctor you have chosen will accept you as a new patient and what types of options they offer for patients who are self-pay. Some doctors offer discounts or will set up payment plans for their patients who do not have insurance, but you will need to ask so you aren't surprised when you get to your appointment.  2) Contact Your Local Health Department Not all health departments have doctors that can see patients for sick visits, but many do, so it is worth a call to see if yours does. If you don't know where your local health department is, you can check in your phone book. The CDC also has a tool to help  you locate your state's health department, and many state websites also have listings of all of their local health departments.  3) Find a [facility] If your illness is not likely to be very severe or complicated, you may want to try a walk in clinic. These are popping up all over the country in pharmacies, drugstores, and shopping centers. They're usually staffed by nurse practitioners or physician assistants that have been trained to treat common illnesses and complaints. They're usually fairly quick and inexpensive. However, if you have serious medical issues or chronic medical problems, these are probably not your best option.  No Primary Care Doctor: - Call Health  Connect at  [phone] - they can help you locate a primary care doctor that  accepts your insurance, provides certain services, etc. - Physician Referral Service- [phone]  Chronic Pain Problems: Organization         Address  Phone   Notes  [facility]  [phone] Patients need to be referred by their primary care doctor.   Medication Assistance: Organization         Address  Phone   Notes  [facility] Medication [facility] [address] [place], [place] [address] [phone] --Must be a resident of [facility] -- Must have NO insurance coverage whatsoever (no Medicaid/ Medicare, etc.) -- The pt. MUST have a primary care doctor that directs their care regularly and follows them in the community   [facility]  [phone]   [employer]  [phone]    Agencies that provide inexpensive medical care: Organization         Address  Phone   Notes  [place]  [phone]   [facility]    [phone]   [facility] [place], [place] [address] [phone]   [place] [address], [place] [phone]   Planned Parenthood    [phone]   [facility]    [phone]   [place] [address], [place] Phone:  [phone], Fax:  [phone] Hours of Operation:  9 am - 6 pm, M-F.  Also accepts Medicaid/Medicare and self-pay.  [facility] for [place], [address], [place] Phone: [phone], Fax: [phone]. Hours of Operation:  8:30 am - 5:30 pm, M-F.  Also accepts Medicaid and self-pay.  [facility] [place] [address] [place] Phone: [phone]   [place], [place], [place] [phone], Ext. [phone] Mondays & Thursdays: 7-9 AM.  First 15 patients are seen on a first come, first serve basis.     [place] Providers:  Organization         Address  Phone   Notes  [facility] [address], [place] [phone] Also accepts self-pay patients.  [facility] [address], [place]  [phone]   [place], [address], [place] [phone]   [facility] [address], [place] [phone]   Lucianne Lei [address], [place]   [phone] Only accepts [place] Access [place] patients after they have their name applied to their card.   Self-Pay (no insurance) in [facility]:  Organization  Address  Phone   Notes  Sickle Cell Patients, [facility] [place] [phone]   [facility] [place] [phone]   [facility] [place]  [place], [address], [place] [phone]   [facility]/Dr. Osei-Bonsu  [address] [place] or [address], [place] [phone] Phone number for both [place] and [place] locations is the same.  [facility] [address], [place] [phone]   [facility] [address], [place] or [address] [phone]   [facility] [address] [place] [phone], phone; [phone], fax Sees patients 1st and 3rd Saturday of every month.  Must not qualify for public or private insurance (i.e. Medicaid, Medicare, [place] Health Choice, Veterans' Benefits)  Household income should be no more than 200% of the poverty level The clinic cannot treat you if you are pregnant or think you are pregnant  Sexually transmitted diseases are not treated at the clinic.    Dental Care: Organization         Address  Phone  Notes  [facility]  Department of [facility] [phone] Accepts children up to age 62 who are enrolled in [place] or [place]; pregnant women with a Medicaid card; and children who have applied for Medicaid or [place] Health Choice, but were declined, whose parents can pay a reduced fee at time of service.  [facility] of [facility]  [address], [place] [phone] Accepts children up to age 70 who are enrolled in [place] or [place]; pregnant women with a Medicaid card; and children who have applied for Medicaid or [place] Health Choice, but were declined, whose parents can pay a reduced fee at time of service.  [place] [facility]  [place] [phone] Patients are seen by appointment only. Walk-ins are not accepted. [place] will see patients 15 years of age and older. Monday - Tuesday (8am-5pm) Most Wednesdays (8:30-5pm) $30 per visit, cash only  [facility] Adult [facility] Access PRO[facility]  [address], [facility] [phone] Patients are seen by appointment only. Walk-ins are not accepted. [place] will see patients 45 years of age and older. One Wednesday Evening (Monthly: Volunteer Based).  $30 per visit, cash only  [place]  [phone] for adults; Children under age 51, call Graduate Pediatric Dentistry at [phone]. Children aged 69-14, please call [phone] to request a pediatric application.  Dental services are provided in all areas of dental care including fillings, crowns and bridges, complete and partial dentures, implants, gum treatment, root canals, and extractions. Preventive care is also provided. Treatment is provided to both adults and children. Patients are selected via a lottery and there is often a waiting list.   [facility] [address], [place]  [phone]  [email or web address]   [facility] [address] [place], [place] [phone], Ext. [phone] Second and Fourth Thursday of each month, opens at 6:30 AM; Clinic ends at 9 AM.  Patients are seen on a first-come first-served basis, and a limited number are seen during each clinic.   [facility]  [address] [place], [place]  [place], [place] [phone]   Eligibility Requirements You must have lived in [place], [place], or [place] counties for at least the last three months.   You cannot be eligible for state or federal sponsored [place]healthcare insurance, including CIG[employer]eterans Administration, [place]icaid, or [employer]icare.   You generally cannot be eligible for healthcare insurance through your employer.    How to apply: Eligibility screenings are held every Tuesday and Wednesday afternoon from 1:00 pm until 4:00 pm. You do not need an appointment for the interview!  [facility] [address], [place], [place] [phone]   [facility]  [phone]   [facility]  [phone]   [facility]  [phone]

## 2015-06-07 NOTE — ED Provider Notes (Signed)
CSN: [id number]     Arrival date & time 06/07/15  1807 History   First MD Initiated Contact with Patient 06/07/15 1815     Chief Complaint  Patient presents with  . Dental Problem   HPI  Matthew Reyes is a 24 year old male presenting with dental pain. He reports onset of dental pain 3 days ago. The pain is located in the right upper molar. He reports cracking this tooth within the last year. He has not seen a dentist for this issue. Pain is exacerbated by drinking cold water and chewing on the side. He has not tried any over-the-counter medications for the pain. He does not have a dentist he visits regularly. Denies fevers, chills, difficulty handling secretions, difficulty breathing, difficulty swallowing, throat pain, facial swelling, neck pain, chest pain or SOB.   Past Medical History  Diagnosis Date  . Dental caries    History reviewed. No pertinent past surgical history. No family history on file. Social History  Substance Use Topics  . Smoking status: Current Every Day Smoker -- 0.00 packs/day    Types: Cigarettes  . Smokeless tobacco: None  . Alcohol Use: Yes    Review of Systems  HENT: Positive for dental problem.       Allergies  Amoxicillin  Home Medications   Prior to Admission medications   Medication Sig Start Date End Date Taking? Authorizing Provider  cephALEXin (KEFLEX) 500 MG capsule Take 1 capsule (500 mg total) by mouth 3 (three) times daily. 03/17/15   Marily MemosJason Mesner, MD  doxycycline (VIBRAMYCIN) 100 MG capsule Take 1 capsule (100 mg total) by mouth 2 (two) times daily. 06/07/15   Rollyn Scialdone, PA-C  ibuprofen (ADVIL,MOTRIN) 200 MG tablet Take 200 mg by mouth every 6 (six) hours as needed for mild pain.    Historical Provider, MD  ondansetron (ZOFRAN ODT) 4 MG disintegrating tablet Take 1 tablet (4 mg total) by mouth every 8 (eight) hours as needed for nausea or vomiting. 03/19/15   Shon Batonourtney F Horton, MD  oxyCODONE-acetaminophen (PERCOCET/ROXICET) 5-325  MG tablet Take 1-2 tablets by mouth every 8 (eight) hours as needed for severe pain. 03/17/15   Marily MemosJason Mesner, MD  traMADol (ULTRAM) 50 MG tablet Take 1 tablet (50 mg total) by mouth every 6 (six) hours as needed. 03/19/15   Shon Batonourtney F Horton, MD   BP 116/67 mmHg  Pulse 94  Temp(Src) 98.2 F (36.8 C) (Oral)  Resp 20  Ht 5\' 8"  (1.727 m)  Wt 59.875 kg  BMI 20.08 kg/m2  SpO2 99% Physical Exam  Constitutional: He appears well-developed and well-nourished. No distress.  HENT:  Head: Normocephalic and atraumatic.  Mouth/Throat: Uvula is midline and oropharynx is clear and moist. Mucous membranes are not dry. No trismus in the jaw. Dental caries present. No dental abscesses or uvula swelling. No posterior oropharyngeal edema or posterior oropharyngeal erythema.  Severe dental decay throughout. Right third molar with significant decay and TTP. No erythema or swelling of the surrounding gumline. No dental abscess. No erythema or edema of the oropharynx. No sublingual erythema, induration or tenderness.   Eyes: Conjunctivae are normal. Right eye exhibits no discharge. Left eye exhibits no discharge. No scleral icterus.  Neck: Normal range of motion. Neck supple.  No soft tissue swelling of the neck.   Cardiovascular: Normal rate and regular rhythm.   Pulmonary/Chest: Effort normal. No stridor. No respiratory distress.  Musculoskeletal: Normal range of motion.  Lymphadenopathy:    He has no cervical adenopathy.  Neurological: He  is alert. Coordination normal.  Skin: Skin is warm and dry.  Psychiatric: He has a normal mood and affect. His behavior is normal.  Nursing note and vitals reviewed.   ED Course  Procedures (including critical care time) Labs Review Labs Reviewed - No data to display  Imaging Review No results found. I have personally reviewed and evaluated these images and lab results as part of my medical decision-making.   EKG Interpretation None      MDM   Final  diagnoses:  Pain, dental   Patient presenting with tooth pain. No obvious abscess on exam. No soft tissue swelling of the cheek or neck. Exam unconcerning for Ludwig's angina or spread of infection.  Pain controlled in ED with tylenol. Will discharge with doxycycline due to PCN allergy and encouraged pt to follow-up with dentist. Resource guide given in discharge paperwork. Return precautions discussed with pt and given in discharge paperwork. Stable for discharge.      Alveta Heimlich, PA-C 06/07/15 2022  Margarita Grizzle, MD 06/08/15 [phone]

## 2015-06-27 ENCOUNTER — Encounter (HOSPITAL_BASED_OUTPATIENT_CLINIC_OR_DEPARTMENT_OTHER): Payer: Self-pay

## 2015-06-27 ENCOUNTER — Emergency Department (HOSPITAL_BASED_OUTPATIENT_CLINIC_OR_DEPARTMENT_OTHER)
Admission: EM | Admit: 2015-06-27 | Discharge: 2015-06-27 | Disposition: A | Discharge status: Home / Self Care | Payer: Self-pay | Attending: Emergency Medicine | Admitting: Emergency Medicine

## 2015-06-27 DIAGNOSIS — J069 Acute upper respiratory infection, unspecified: Secondary | ICD-10-CM | POA: Insufficient documentation

## 2015-06-27 DIAGNOSIS — Z8719 Personal history of other diseases of the digestive system: Secondary | ICD-10-CM | POA: Insufficient documentation

## 2015-06-27 DIAGNOSIS — Z87891 Personal history of nicotine dependence: Secondary | ICD-10-CM | POA: Insufficient documentation

## 2015-06-27 DIAGNOSIS — Z880 Allergy status to penicillin: Secondary | ICD-10-CM | POA: Insufficient documentation

## 2015-06-27 HISTORY — DX: Cocaine abuse, uncomplicated: F14.10

## 2015-06-27 HISTORY — DX: Alcohol abuse, uncomplicated: F10.10

## 2015-06-27 LAB — RAPID STREP SCREEN (MED CTR MEBANE ONLY): Streptococcus, Group A Screen (Direct): NEGATIVE

## 2015-06-27 MED ORDER — PROMETHAZINE-DM 6.25-15 MG/5ML PO SYRP: 5.0000 mL | ORAL_SOLUTION | Freq: Four times a day (QID) | ORAL | Status: AC | PRN

## 2015-06-27 MED ORDER — GUAIFENESIN 100 MG/5ML PO LIQD: 100.0000 mg | ORAL | Status: AC | PRN

## 2015-06-27 NOTE — Discharge Instructions (Signed)
Upper Respiratory Infection, Adult Most upper respiratory infections (URIs) are a viral infection of the air passages leading to the lungs. A URI affects the nose, throat, and upper air passages. The most common type of URI is nasopharyngitis and is typically referred to as "the common cold." URIs run their course and usually go away on their own. Most of the time, a URI does not require medical attention, but sometimes a bacterial infection in the upper airways can follow a viral infection. This is called a secondary infection. Sinus and middle ear infections are common types of secondary upper respiratory infections. Bacterial pneumonia can also complicate a URI. A URI can worsen asthma and chronic obstructive pulmonary disease (COPD). Sometimes, these complications can require emergency medical care and may be life threatening.  CAUSES Almost all URIs are caused by viruses. A virus is a type of germ and can spread from one person to another.  RISKS FACTORS You may be at risk for a URI if:   You smoke.   You have chronic heart or lung disease.  You have a weakened defense (immune) system.   You are very young or very old.   You have nasal allergies or asthma.  You work in crowded or poorly ventilated areas.  You work in health care facilities or schools. SIGNS AND SYMPTOMS  Symptoms typically develop 2-3 days after you come in contact with a cold virus. Most viral URIs last 7-10 days. However, viral URIs from the influenza virus (flu virus) can last 14-18 days and are typically more severe. Symptoms may include:   Runny or stuffy (congested) nose.   Sneezing.   Cough.   Sore throat.   Headache.   Fatigue.   Fever.   Loss of appetite.   Pain in your forehead, behind your eyes, and over your cheekbones (sinus pain).  Muscle aches.  DIAGNOSIS  Your health care provider may diagnose a URI by:  Physical exam.  Tests to check that your symptoms are not due to  another condition such as:  Strep throat.  Sinusitis.  Pneumonia.  Asthma. TREATMENT  A URI goes away on its own with time. It cannot be cured with medicines, but medicines may be prescribed or recommended to relieve symptoms. Medicines may help:  Reduce your fever.  Reduce your cough.  Relieve nasal congestion. HOME CARE INSTRUCTIONS   Take medicines only as directed by your health care provider.   Gargle warm saltwater or take cough drops to comfort your throat as directed by your health care provider.  Use a warm mist humidifier or inhale steam from a shower to increase air moisture. This may make it easier to breathe.  Drink enough fluid to keep your urine clear or pale yellow.   Eat soups and other clear broths and maintain good nutrition.   Rest as needed.   Return to work when your temperature has returned to normal or as your health care provider advises. You may need to stay home longer to avoid infecting others. You can also use a face mask and careful hand washing to prevent spread of the virus.  Increase the usage of your inhaler if you have asthma.   Do not use any tobacco products, including cigarettes, chewing tobacco, or electronic cigarettes. If you need help quitting, ask your health care provider. PREVENTION  The best way to protect yourself from getting a cold is to practice good hygiene.   Avoid oral or hand contact with people with cold   symptoms.   Wash your hands often if contact occurs.  There is no clear evidence that vitamin C, vitamin E, echinacea, or exercise reduces the chance of developing a cold. However, it is always recommended to get plenty of rest, exercise, and practice good nutrition.  SEEK MEDICAL CARE IF:   You are getting worse rather than better.   Your symptoms are not controlled by medicine.   You have chills.  You have worsening shortness of breath.  You have brown or red mucus.  You have yellow or brown nasal  discharge.  You have pain in your face, especially when you bend forward.  You have a fever.  You have swollen neck glands.  You have pain while swallowing.  You have white areas in the back of your throat. SEEK IMMEDIATE MEDICAL CARE IF:   You have severe or persistent:  Headache.  Ear pain.  Sinus pain.  Chest pain.  You have chronic lung disease and any of the following:  Wheezing.  Prolonged cough.  Coughing up blood.  A change in your usual mucus.  You have a stiff neck.  You have changes in your:  Vision.  Hearing.  Thinking.  Mood. MAKE SURE YOU:   Understand these instructions.  Will watch your condition.  Will get help right away if you are not doing well or get worse.   This information is not intended to replace advice given to you by your health care provider. Make sure you discuss any questions you have with your health care provider.   Document Released: 11/18/2000 Document Revised: 10/09/2014 Document Reviewed: 08/30/2013 Elsevier Interactive Patient Education 2016 Elsevier Inc.  

## 2015-06-27 NOTE — ED Notes (Signed)
C/o sinus pressure x 3 weeks and "throat feels tight" x 3 dys-pt is day 30 at [facility] for ETOH, cocaine abuse-pt NAD-steady gait

## 2015-06-27 NOTE — ED Provider Notes (Signed)
CSN: [id number]     Arrival date & time 06/27/15  1502 History   First MD Initiated Contact with Patient 06/27/15 1528     Chief Complaint  Patient presents with  . URI  . [place]      (Consider location/radiation/quality/duration/timing/severity/associated sxs/prior Treatment) HPI   25 year old male with history of cocaine abuse and alcohol abuse currently staying at [facility] who presents for evaluation of sore throat. For the past 3 days patient has had nasal congestion, sneezing, sore throat, and productive cough. He denies any fever, headache, nausea vomiting diarrhea, chest pain, shortness of breath, or rash. Has tried taking ibuprofen and Robitussin without relief. He has no other complaint. States that sinus pressure has been ongoing for the past several weeks.     Past Medical History  Diagnosis Date  . Dental caries   . ETOH abuse   . Cocaine abuse    History reviewed. No pertinent past surgical history. No family history on file. Social History  Substance Use Topics  . Smoking status: Former Smoker -- 0.00 packs/day    Types: Cigarettes  . Smokeless tobacco: None  . Alcohol Use: No     Comment: in rehab    Review of Systems  All other systems reviewed and are negative.     Allergies  Amoxicillin  Home Medications   Prior to Admission medications   Not on File   BP 121/69 mmHg  Pulse 90  Temp(Src) 98.3 F (36.8 C) (Oral)  Resp 18  Ht  (1.727 m)  Wt 69.4 kg  BMI 23.27 kg/m2  SpO2 97% Physical Exam  Constitutional: He appears well-developed and well-nourished. No distress.  HENT:  Head: Atraumatic.  Ears: Normal TMs bilaterally Nose: Mild rhinorrhea Throat: Uvula is midline, no tonsillar enlargement or exudates, no trismus  Eyes: Conjunctivae are normal.  Neck: Neck supple.  Cardiovascular: Normal rate and regular rhythm.   Pulmonary/Chest: Effort normal and breath sounds normal. No respiratory distress. He has no wheezes. He has no  rales.  Abdominal: Soft. There is no tenderness.  Neurological: He is alert.  Skin: No rash noted.  Psychiatric: He has a normal mood and affect.  Nursing note and vitals reviewed.   ED Course  Procedures (including critical care time) Labs Review Labs Reviewed  RAPID STREP SCREEN (NOT AT [facility])    Imaging Review No results found. I have personally reviewed and evaluated these images and lab results as part of my medical decision-making.   EKG Interpretation None      MDM   Final diagnoses:  URI (upper respiratory infection)    BP 121/69 mmHg  Pulse 90  Temp(Src) 98.3 F (36.8 C) (Oral)  Resp 18  Ht  (1.727 m)  Wt 69.4 kg  BMI 23.27 kg/m2  SpO2 97%   4:24 PM Patient presents with URI symptoms. No concerning finding on exam. Will treat symptoms. Otherwise patient is stable for discharge.  Fayrene Helper, PA-C 06/27/15 1626  Leta Baptist, MD 06/28/15 [phone]

## 2015-06-30 LAB — CULTURE, GROUP A STREP (THRC)

## 2017-08-14 ENCOUNTER — Emergency Department (HOSPITAL_COMMUNITY)
Admission: EM | Admit: 2017-08-14 | Discharge: 2017-08-14 | Disposition: A | Discharge status: Home / Self Care | Payer: Self-pay | Attending: Emergency Medicine | Admitting: Emergency Medicine

## 2017-08-14 ENCOUNTER — Emergency Department (HOSPITAL_COMMUNITY): Payer: Self-pay

## 2017-08-14 ENCOUNTER — Encounter (HOSPITAL_COMMUNITY): Payer: Self-pay | Admitting: Emergency Medicine

## 2017-08-14 ENCOUNTER — Other Ambulatory Visit: Payer: Self-pay

## 2017-08-14 DIAGNOSIS — M25562 Pain in left knee: Secondary | ICD-10-CM | POA: Insufficient documentation

## 2017-08-14 DIAGNOSIS — Z87891 Personal history of nicotine dependence: Secondary | ICD-10-CM | POA: Insufficient documentation

## 2017-08-14 DIAGNOSIS — M25572 Pain in left ankle and joints of left foot: Secondary | ICD-10-CM | POA: Insufficient documentation

## 2017-08-14 MED ORDER — IBUPROFEN 800 MG PO TABS: 800.0000 mg | ORAL_TABLET | Freq: Three times a day (TID) | ORAL | 0 refills | Status: AC

## 2017-08-14 NOTE — ED Provider Notes (Signed)
[facility]   CSN: [id number] Arrival date & time: 08/14/17  0142     History   Chief Complaint Chief Complaint  Patient presents with  . Leg Pain    HPI Matthew HatcherJohn Edward Reyes is a 27 y.o. male.  The history is provided by the patient and medical records.     27 y.o. M with hx of alcohol and cocaine abuse, presenting to the ED with left ankle and knee pain after a fall 2 days ago. States he was about 4 feet up on a ladder.  Denies head injury or LOC.  States when he fell his left leg was bent awkwardly behind him.  Reports ongoing pain in mostly left ankle but some in the left knee.  He denies any numbness of the foot.  Patient is currently homeless and has to walk around a lot to get anywhere.  Past Medical History:  Diagnosis Date  . Cocaine abuse (HCC)   . Dental caries   . ETOH abuse     There are no active problems to display for this patient.   History reviewed. No pertinent surgical history.     Home Medications    Prior to Admission medications   Medication Sig Start Date End Date Taking? Authorizing Provider  guaiFENesin (ROBITUSSIN) 100 MG/5ML liquid Take 5-10 mLs (100-200 mg total) by mouth every 4 (four) hours as needed for congestion. 06/27/15   Fayrene Helperran, Bow[place], PA-C  promethazine-dextromethorphan (PROMETHAZINE-DM) 6.25-15 MG/5ML syrup Take 5 mLs by mouth 4 (four) times daily as needed for cough. 06/27/15   Fayrene Helperran, [place], PA-C    Family History No family history on file.  Social History Social History   Tobacco Use  . Smoking status: Former Smoker    Packs/day: 0.00    Types: Cigarettes  . Smokeless tobacco: Never Used  Substance Use Topics  . Alcohol use: No    Comment: in rehab  . Drug use: No    Comment: in rehab     Allergies   Amoxicillin   Review of Systems Review of Systems  Musculoskeletal: Positive for arthralgias.  All other systems reviewed and are negative.    Physical  Exam Updated Vital Signs BP 131/77 (BP Location: Left Arm)   Pulse (!) 108   Temp 97.6 F (36.4 C) (Oral)   Resp 18   Ht 5\' 8"  (1.727 m)   Wt 61.2 kg (135 lb)   SpO2 96%   BMI 20.53 kg/m   Physical Exam  Constitutional: He is oriented to person, place, and time. He appears well-developed and well-nourished.  HENT:  Head: Normocephalic and atraumatic.  Mouth/Throat: Oropharynx is clear and moist.  Eyes: Conjunctivae and EOM are normal. Pupils are equal, round, and reactive to light.  Neck: Normal range of motion.  Cardiovascular: Normal rate, regular rhythm and normal heart sounds.  Pulmonary/Chest: Effort normal and breath sounds normal.  Abdominal: Soft. Bowel sounds are normal.  Musculoskeletal: Normal range of motion.  Swelling and bruising noted surrounding left lateral malleolus;  No gross deformities; DP pulse intact; moving toes normally; normal sensation throughout foot Left knee overall normal in appearance, full flexion/extension maintained; no focal tenderness, no deformities  Neurological: He is alert and oriented to person, place, and time.  Skin: Skin is warm and dry.  Psychiatric: He has a normal mood and affect.  Nursing note and vitals reviewed.    ED Treatments / Results  Labs (all labs ordered are listed, but  only abnormal results are displayed) Labs Reviewed - No data to display  EKG  EKG Interpretation None       Radiology Dg Ankle Complete Left  Result Date: 08/14/2017 CLINICAL DATA:  Status post fall off ladder, with left ankle pain. Initial encounter. EXAM: LEFT ANKLE COMPLETE - 3+ VIEW COMPARISON:  None. FINDINGS: There is no evidence of fracture or dislocation. The ankle mortise is intact; the interosseous space is within normal limits. No talar tilt or subluxation is seen. An os trigonum is noted. The joint spaces are preserved. No significant soft tissue abnormalities are seen. IMPRESSION: 1. No evidence of fracture or dislocation. 2. Os  trigonum noted. Electronically Signed   By: Roanna Raider M.D.   On: 08/14/2017 02:39   Dg Knee Complete 4 Views Left  Result Date: 08/14/2017 CLINICAL DATA:  Status post fall off ladder, with left knee pain. Initial encounter. EXAM: LEFT KNEE - COMPLETE 4+ VIEW COMPARISON:  None. FINDINGS: There is no evidence of fracture or dislocation. The joint spaces are preserved. No significant degenerative change is seen; the patellofemoral joint is grossly unremarkable in appearance. No significant joint effusion is seen. The visualized soft tissues are normal in appearance. IMPRESSION: No evidence of fracture or dislocation. Electronically Signed   By: Roanna Raider M.D.   On: 08/14/2017 02:40    Procedures Procedures (including critical care time)  Medications Ordered in ED Medications - No data to display   Initial Impression / Assessment and Plan / ED Course  I have reviewed the triage vital signs and the nursing notes.  Pertinent labs & imaging results that were available during my care of the patient were reviewed by me and considered in my medical decision making (see chart for details).  27 year old male here with left ankle and knee pain after a fall from ladder.  He was approximately 4 feet up.  There is no head injury or loss of consciousness.  He does have a fair amount of swelling to the left lateral ankle on exam.  No gross bony deformities.  He remains ambulatory but with slight limp favoring the left leg.  Limbs are neurovascularly intact.  X-rays without any acute bony findings.  He was placed in ASO ankle splint.  Will start anti-inflammatories, follow-up with orthopedics if any ongoing issues.  Discussed plan with patient, he acknowledged understanding and agreed with plan of care.  Return precautions given for new or worsening symptoms.  Final Clinical Impressions(s) / ED Diagnoses   Final diagnoses:  Acute left ankle pain    ED Discharge Orders        Ordered    ibuprofen  (ADVIL,MOTRIN) 800 MG tablet  3 times daily     08/14/17 0508       Garlon Hatchet, PA-C 08/14/17 1610    Gilda Crease, MD 08/14/17 [phone]

## 2017-08-14 NOTE — Discharge Instructions (Signed)
Take the prescribed medication as directed. Follow-up with ortho if you have ongoing issues. Return to the ED for new or worsening symptoms.

## 2017-08-14 NOTE — ED Triage Notes (Signed)
Reports falling off a ladder three days ago about 3-4 feet.  C/o pain in left knee and ankle.  Taking ibuprofen at home.  Last dose around 2100 Friday night.

## 2018-01-14 ENCOUNTER — Emergency Department (HOSPITAL_COMMUNITY)
Admission: EM | Admit: 2018-01-14 | Discharge: 2018-01-14 | Disposition: A | Discharge status: Home / Self Care | Payer: Self-pay | Attending: Emergency Medicine | Admitting: Emergency Medicine

## 2018-01-14 DIAGNOSIS — K0889 Other specified disorders of teeth and supporting structures: Secondary | ICD-10-CM

## 2018-01-14 DIAGNOSIS — Z87891 Personal history of nicotine dependence: Secondary | ICD-10-CM | POA: Insufficient documentation

## 2018-01-14 DIAGNOSIS — F10220 Alcohol dependence with intoxication, uncomplicated: Secondary | ICD-10-CM | POA: Insufficient documentation

## 2018-01-14 DIAGNOSIS — F10920 Alcohol use, unspecified with intoxication, uncomplicated: Secondary | ICD-10-CM

## 2018-01-14 NOTE — Discharge Instructions (Signed)
Avoid alcohol use.  See the dentist of your choice for ongoing management of your dental concerns.
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
## 2018-01-14 NOTE — ED Notes (Signed)
Pt refused RN to do vitals or triage him. MD made aware

## 2018-03-09 ENCOUNTER — Other Ambulatory Visit: Payer: Self-pay

## 2018-03-09 ENCOUNTER — Emergency Department (HOSPITAL_COMMUNITY)
Admission: EM | Admit: 2018-03-09 | Discharge: 2018-03-09 | Disposition: A | Discharge status: Home / Self Care | Payer: Self-pay | Attending: Emergency Medicine | Admitting: Emergency Medicine

## 2018-03-09 ENCOUNTER — Ambulatory Visit (HOSPITAL_COMMUNITY): Admission: RE | Admit: 2018-03-09 | Payer: Self-pay | Source: Ambulatory Visit

## 2018-03-09 DIAGNOSIS — Z87891 Personal history of nicotine dependence: Secondary | ICD-10-CM | POA: Insufficient documentation

## 2018-03-09 DIAGNOSIS — Y998 Other external cause status: Secondary | ICD-10-CM | POA: Insufficient documentation

## 2018-03-09 DIAGNOSIS — Z5320 Procedure and treatment not carried out because of patient's decision for unspecified reasons: Secondary | ICD-10-CM | POA: Insufficient documentation

## 2018-03-09 DIAGNOSIS — Y929 Unspecified place or not applicable: Secondary | ICD-10-CM | POA: Insufficient documentation

## 2018-03-09 DIAGNOSIS — S0181XA Laceration without foreign body of other part of head, initial encounter: Secondary | ICD-10-CM | POA: Insufficient documentation

## 2018-03-09 DIAGNOSIS — Y9389 Activity, other specified: Secondary | ICD-10-CM | POA: Insufficient documentation

## 2018-03-09 DIAGNOSIS — Z0471 Encounter for examination and observation following alleged adult physical abuse: Secondary | ICD-10-CM | POA: Insufficient documentation
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
## 2018-03-09 NOTE — ED Notes (Addendum)
First IV unsucessful due to patient not extending his arm. Patient refusing blood work at this time. Wound irrigated as best as tolerated. Patient refused gauze soak. MD notified.

## 2018-03-09 NOTE — ED Notes (Addendum)
Patient left AMA. Refusing vital signs. Patient signed out AMA. MD aware. Paper work given to patient.

## 2019-08-02 ENCOUNTER — Other Ambulatory Visit: Payer: Self-pay

## 2019-08-02 ENCOUNTER — Encounter (HOSPITAL_COMMUNITY): Payer: Self-pay

## 2019-08-02 ENCOUNTER — Emergency Department (HOSPITAL_COMMUNITY)
Admission: EM | Admit: 2019-08-02 | Discharge: 2019-08-02 | Disposition: A | Discharge status: Home / Self Care | Payer: Self-pay | Attending: Emergency Medicine | Admitting: Emergency Medicine

## 2019-08-02 DIAGNOSIS — F1010 Alcohol abuse, uncomplicated: Secondary | ICD-10-CM | POA: Insufficient documentation

## 2019-08-02 DIAGNOSIS — Z79899 Other long term (current) drug therapy: Secondary | ICD-10-CM | POA: Insufficient documentation

## 2019-08-02 DIAGNOSIS — Z87891 Personal history of nicotine dependence: Secondary | ICD-10-CM | POA: Insufficient documentation

## 2019-08-02 MED ORDER — CHLORDIAZEPOXIDE HCL 25 MG PO CAPS: ORAL_CAPSULE | ORAL | 0 refills | Status: AC
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
## 2019-08-02 NOTE — ED Notes (Signed)
Pt on phone with peer support

## 2019-08-02 NOTE — ED Notes (Signed)
Waiting for peer support consult

## 2019-08-02 NOTE — ED Notes (Signed)
Per peer support pt has been accepted to [facility]. Safe transport called for transportation. Face sheet and DC instructions at bedside.
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
## 2019-08-02 NOTE — ED Notes (Signed)
Pt left without DC paperwork but verbalized understanding of instructions.    Security just notified this RN that they were able to locate pt and pt signed off for his belongings

## 2019-08-02 NOTE — ED Notes (Signed)
Pt provided sandwich and apple juice

## 2019-08-02 NOTE — ED Triage Notes (Signed)
Pt requesting detox from alcohol.

## 2019-08-02 NOTE — Patient Outreach (Signed)
Pt was accepted to [facility], [address], [place] [address]. Pt is to have a picture ID which facility stated they will accept a photo an name from his face sheet or snapshot from his chart. Pt will need transportation to facility. Contact number to [employer] [phone] ext. [phone]).

## 2019-08-02 NOTE — Discharge Instructions (Addendum)
Take Librium as prescribed to help stop drinking. Resource guide provided for residential and outpatient substance abuse assistance. Peer support was contacted by the emergency room and will follow up with you after your visit to the ER today.

## 2019-08-02 NOTE — ED Notes (Signed)
Pt states " want a cigarette I am leaving. Fuck this shit shit happens I am leaving I don't care about the help I just want to leave give me back my knifes and shit I am leaving right now to go get a cigarette"   This RN consulted security to gather pt belongings. Unable to locate pt inside room or outside lobby.outside ED entrance. Pt belongings returned to security office.   Safe transport cancelled

## 2019-08-27 ENCOUNTER — Emergency Department (HOSPITAL_COMMUNITY): Payer: Self-pay

## 2019-08-27 ENCOUNTER — Encounter (HOSPITAL_COMMUNITY): Payer: Self-pay

## 2019-08-27 ENCOUNTER — Emergency Department (HOSPITAL_COMMUNITY)
Admission: EM | Admit: 2019-08-27 | Discharge: 2019-08-27 | Discharge status: Against Medical Advice | Payer: Self-pay | Attending: Emergency Medicine | Admitting: Emergency Medicine

## 2019-08-27 DIAGNOSIS — Y92410 Unspecified street and highway as the place of occurrence of the external cause: Secondary | ICD-10-CM | POA: Insufficient documentation

## 2019-08-27 DIAGNOSIS — T1490XA Injury, unspecified, initial encounter: Secondary | ICD-10-CM

## 2019-08-27 DIAGNOSIS — Y9301 Activity, walking, marching and hiking: Secondary | ICD-10-CM | POA: Diagnosis not present

## 2019-08-27 DIAGNOSIS — F17200 Nicotine dependence, unspecified, uncomplicated: Secondary | ICD-10-CM | POA: Insufficient documentation

## 2019-08-27 DIAGNOSIS — Y999 Unspecified external cause status: Secondary | ICD-10-CM | POA: Insufficient documentation

## 2019-08-27 DIAGNOSIS — M25561 Pain in right knee: Secondary | ICD-10-CM | POA: Diagnosis present

## 2019-08-27 LAB — COMPREHENSIVE METABOLIC PANEL
ALT: 85 U/L — ABNORMAL HIGH (ref 0–44)
AST: 145 U/L — ABNORMAL HIGH (ref 15–41)
Albumin: 4 g/dL (ref 3.5–5.0)
Alkaline Phosphatase: 68 U/L (ref 38–126)
Anion gap: 13 (ref 5–15)
BUN: 12 mg/dL (ref 6–20)
CO2: 25 mmol/L (ref 22–32)
Calcium: 8.9 mg/dL (ref 8.9–10.3)
Chloride: 105 mmol/L (ref 98–111)
Creatinine, Ser: 1.01 mg/dL (ref 0.61–1.24)
GFR calc Af Amer: >60 mL/min (ref >60)
GFR calc non Af Amer: >60 mL/min (ref >60)
Glucose, Bld: 107 mg/dL — ABNORMAL HIGH (ref 70–99)
Potassium: 3.4 mmol/L — ABNORMAL LOW (ref 3.5–5.1)
Sodium: 143 mmol/L (ref 135–145)
Total Bilirubin: 0.7 mg/dL (ref 0.3–1.2)
Total Protein: 7.4 g/dL (ref 6.5–8.1)

## 2019-08-27 LAB — CBC WITH DIFFERENTIAL/PLATELET
Abs Immature Granulocytes: 0.04 10*3/uL (ref 0.00–0.07)
Basophils Absolute: 0.1 10*3/uL (ref 0.0–0.1)
Basophils Relative: 1 %
Eosinophils Absolute: 0.1 10*3/uL (ref 0.0–0.5)
Eosinophils Relative: 1 %
HCT: 45.1 % (ref 39.0–52.0)
Hemoglobin: 15 g/dL (ref 13.0–17.0)
Immature Granulocytes: 1 %
Lymphocytes Relative: 45 %
Lymphs Abs: 3.5 10*3/uL (ref 0.7–4.0)
MCH: 32.3 pg (ref 26.0–34.0)
MCHC: 33.3 g/dL (ref 30.0–36.0)
MCV: 97.2 fL (ref 80.0–100.0)
Monocytes Absolute: 0.8 10*3/uL (ref 0.1–1.0)
Monocytes Relative: 10 %
Neutro Abs: 3.2 10*3/uL (ref 1.7–7.7)
Neutrophils Relative %: 42 %
Platelets: 207 10*3/uL (ref 150–400)
RBC: 4.64 MIL/uL (ref 4.22–5.81)
RDW: 14.4 % (ref 11.5–15.5)
WBC: 7.7 10*3/uL (ref 4.0–10.5)
nRBC: 0 % (ref 0.0–0.2)

## 2019-08-27 MED ORDER — LACTATED RINGERS IV BOLUS
1000.0000 mL | Freq: Once | INTRAVENOUS | Status: AC
  Administered 2019-08-27: 1000 mL via INTRAVENOUS

## 2019-08-27 NOTE — ED Notes (Signed)
Pt states that he is leaving so that he can go do more drugs and ETOH, pt removed his IV, pt yelling and cursing in room, MD notified that pt is leaving AMA
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
## 2019-08-28 ENCOUNTER — Encounter (HOSPITAL_COMMUNITY): Payer: Self-pay

## 2019-09-10 ENCOUNTER — Emergency Department (HOSPITAL_COMMUNITY): Payer: Self-pay

## 2019-09-10 ENCOUNTER — Other Ambulatory Visit: Payer: Self-pay

## 2019-09-10 ENCOUNTER — Inpatient Hospital Stay (HOSPITAL_COMMUNITY)
Admission: EM | Admit: 2019-09-10 | Discharge: 2019-09-12 | DRG: 917 | Discharge status: Against Medical Advice | Payer: Self-pay | Attending: Internal Medicine | Admitting: Internal Medicine

## 2019-09-10 ENCOUNTER — Encounter (HOSPITAL_COMMUNITY): Payer: Self-pay

## 2019-09-10 DIAGNOSIS — Z79899 Other long term (current) drug therapy: Secondary | ICD-10-CM

## 2019-09-10 DIAGNOSIS — F10230 Alcohol dependence with withdrawal, uncomplicated: Secondary | ICD-10-CM | POA: Diagnosis present

## 2019-09-10 DIAGNOSIS — Z20822 Contact with and (suspected) exposure to covid-19: Secondary | ICD-10-CM | POA: Diagnosis present

## 2019-09-10 DIAGNOSIS — N390 Urinary tract infection, site not specified: Secondary | ICD-10-CM | POA: Diagnosis present

## 2019-09-10 DIAGNOSIS — F10939 Alcohol use, unspecified with withdrawal, unspecified: Secondary | ICD-10-CM | POA: Diagnosis present

## 2019-09-10 DIAGNOSIS — F17290 Nicotine dependence, other tobacco product, uncomplicated: Secondary | ICD-10-CM | POA: Diagnosis present

## 2019-09-10 DIAGNOSIS — Z880 Allergy status to penicillin: Secondary | ICD-10-CM

## 2019-09-10 DIAGNOSIS — T401X1A Poisoning by heroin, accidental (unintentional), initial encounter: Principal | ICD-10-CM | POA: Diagnosis present

## 2019-09-10 DIAGNOSIS — F1410 Cocaine abuse, uncomplicated: Secondary | ICD-10-CM | POA: Diagnosis present

## 2019-09-10 DIAGNOSIS — A419 Sepsis, unspecified organism: Secondary | ICD-10-CM | POA: Diagnosis present

## 2019-09-10 DIAGNOSIS — R6883 Chills (without fever): Secondary | ICD-10-CM

## 2019-09-10 DIAGNOSIS — F10239 Alcohol dependence with withdrawal, unspecified: Secondary | ICD-10-CM | POA: Diagnosis present

## 2019-09-10 DIAGNOSIS — F10930 Alcohol use, unspecified with withdrawal, uncomplicated: Secondary | ICD-10-CM

## 2019-09-10 DIAGNOSIS — D72829 Elevated white blood cell count, unspecified: Secondary | ICD-10-CM | POA: Diagnosis present

## 2019-09-10 DIAGNOSIS — R509 Fever, unspecified: Secondary | ICD-10-CM | POA: Diagnosis present

## 2019-09-10 DIAGNOSIS — I38 Endocarditis, valve unspecified: Secondary | ICD-10-CM | POA: Diagnosis present

## 2019-09-10 DIAGNOSIS — Y903 Blood alcohol level of 60-79 mg/100 ml: Secondary | ICD-10-CM | POA: Diagnosis present

## 2019-09-10 DIAGNOSIS — Z590 Homelessness: Secondary | ICD-10-CM

## 2019-09-10 DIAGNOSIS — F1210 Cannabis abuse, uncomplicated: Secondary | ICD-10-CM | POA: Diagnosis present

## 2019-09-10 LAB — RAPID URINE DRUG SCREEN, HOSP PERFORMED
Amphetamines: NOT DETECTED
Barbiturates: NOT DETECTED
Benzodiazepines: NOT DETECTED
Cocaine: NOT DETECTED
Opiates: NOT DETECTED
Tetrahydrocannabinol: POSITIVE — AB

## 2019-09-10 LAB — COMPREHENSIVE METABOLIC PANEL
ALT: 46 U/L — ABNORMAL HIGH (ref 0–44)
AST: 61 U/L — ABNORMAL HIGH (ref 15–41)
Albumin: 3.6 g/dL (ref 3.5–5.0)
Alkaline Phosphatase: 67 U/L (ref 38–126)
Anion gap: 12 (ref 5–15)
BUN: 13 mg/dL (ref 6–20)
CO2: 25 mmol/L (ref 22–32)
Calcium: 8.8 mg/dL — ABNORMAL LOW (ref 8.9–10.3)
Chloride: 105 mmol/L (ref 98–111)
Creatinine, Ser: 1.25 mg/dL — ABNORMAL HIGH (ref 0.61–1.24)
GFR calc Af Amer: >60 mL/min (ref >60)
GFR calc non Af Amer: >60 mL/min (ref >60)
Glucose, Bld: 117 mg/dL — ABNORMAL HIGH (ref 70–99)
Potassium: 3.9 mmol/L (ref 3.5–5.1)
Sodium: 142 mmol/L (ref 135–145)
Total Bilirubin: 0.5 mg/dL (ref 0.3–1.2)
Total Protein: 7.4 g/dL (ref 6.5–8.1)

## 2019-09-10 LAB — URINALYSIS, ROUTINE W REFLEX MICROSCOPIC
Bilirubin Urine: NEGATIVE
Glucose, UA: NEGATIVE mg/dL
Ketones, ur: NEGATIVE mg/dL
Nitrite: NEGATIVE
Protein, ur: 30 mg/dL — AB
Specific Gravity, Urine: 1.011 (ref 1.005–1.030)
WBC, UA: >50 WBC/hpf — ABNORMAL HIGH (ref 0–5)
pH: 6 (ref 5.0–8.0)

## 2019-09-10 LAB — CBC WITH DIFFERENTIAL/PLATELET
Abs Immature Granulocytes: 0.15 10*3/uL — ABNORMAL HIGH (ref 0.00–0.07)
Basophils Absolute: 0.1 10*3/uL (ref 0.0–0.1)
Basophils Relative: 1 %
Eosinophils Absolute: 0.1 10*3/uL (ref 0.0–0.5)
Eosinophils Relative: 1 %
HCT: 41.2 % (ref 39.0–52.0)
Hemoglobin: 13.5 g/dL (ref 13.0–17.0)
Immature Granulocytes: 1 %
Lymphocytes Relative: 14 %
Lymphs Abs: 1.6 10*3/uL (ref 0.7–4.0)
MCH: 32.7 pg (ref 26.0–34.0)
MCHC: 32.8 g/dL (ref 30.0–36.0)
MCV: 99.8 fL (ref 80.0–100.0)
Monocytes Absolute: 1.2 10*3/uL — ABNORMAL HIGH (ref 0.1–1.0)
Monocytes Relative: 10 %
Neutro Abs: 8.4 10*3/uL — ABNORMAL HIGH (ref 1.7–7.7)
Neutrophils Relative %: 73 %
Platelets: 262 10*3/uL (ref 150–400)
RBC: 4.13 MIL/uL — ABNORMAL LOW (ref 4.22–5.81)
RDW: 13.7 % (ref 11.5–15.5)
WBC: 11.6 10*3/uL — ABNORMAL HIGH (ref 4.0–10.5)
nRBC: 0 % (ref 0.0–0.2)

## 2019-09-10 LAB — LACTIC ACID, PLASMA: Lactic Acid, Venous: 1.3 mmol/L (ref 0.5–1.9)

## 2019-09-10 LAB — PROTIME-INR
INR: 0.9 (ref 0.8–1.2)
Prothrombin Time: 12.2 seconds (ref 11.4–15.2)

## 2019-09-10 LAB — TROPONIN I (HIGH SENSITIVITY): Troponin I (High Sensitivity): 4 ng/L (ref <18)

## 2019-09-10 LAB — ETHANOL: Alcohol, Ethyl (B): 73 mg/dL — ABNORMAL HIGH (ref <10)

## 2019-09-10 MED ORDER — ACETAMINOPHEN 325 MG PO TABS
650.0000 mg | ORAL_TABLET | Freq: Once | ORAL | Status: AC
  Administered 2019-09-10: 21:00:00 650 mg via ORAL
  Filled 2019-09-10: qty 2

## 2019-09-10 MED ORDER — NICOTINE 21 MG/24HR TD PT24
21.0000 mg | MEDICATED_PATCH | Freq: Once | TRANSDERMAL | Status: AC
  Administered 2019-09-10: 21 mg via TRANSDERMAL
  Filled 2019-09-10: qty 1

## 2019-09-10 MED ORDER — LORAZEPAM 2 MG/ML IJ SOLN
1.0000 mg | Freq: Once | INTRAMUSCULAR | Status: AC
  Administered 2019-09-10: 1 mg via INTRAVENOUS
  Filled 2019-09-10: qty 1

## 2019-09-10 MED ORDER — LACTATED RINGERS IV BOLUS
1000.0000 mL | Freq: Once | INTRAVENOUS | Status: AC
  Administered 2019-09-10: 1000 mL via INTRAVENOUS

## 2019-09-10 NOTE — ED Provider Notes (Signed)
[facility] DEPT Provider Note   CSN: [id number] Arrival date & time: 09/10/19  1921     History Chief Complaint  Patient presents with  . Drug Overdose    Matthew Reyes is a 29 y.o. male.   Drug Overdose Pertinent negatives include no chest pain.   Patient presents after heroin overdose.  Found unresponsive.  Reportedly had done pink heroin.  Given Narcan.  Now states that he feels chills.  States he aches all over.  States he is getting help with withdrawal off his alcohol.  Drinks heavily.  Former IV drug user.  States he does not currently inject drugs.  No cough.  No abdominal pain.  No chest pain.  He is a heavy smoker.  States he uses his vape pen often.  States when his recharging he will smoke cigarettes.    Past Medical History:  Diagnosis Date  . Cocaine abuse ([place])   . Dental caries   . ETOH abuse     There are no problems to display for this patient.   History reviewed. No pertinent surgical history.     History reviewed. No pertinent family history.  Social History   Tobacco Use  . Smoking status: Current Every Day Smoker  . Smokeless tobacco: Current User  Substance Use Topics  . Alcohol use: Yes    Comment: half a gallon a day   . Drug use: No    Comment: in rehab    Home Medications Prior to Admission medications   Medication Sig Start Date End Date Taking? Authorizing Provider  chlordiazePOXIDE (LIBRIUM) 25 MG capsule 50mg  PO TID x 1D, then 25-50mg  PO BID X 1D, then 25-50mg  PO QD X 1D 08/02/19   Tacy Learn, PA-C  guaiFENesin (ROBITUSSIN) 100 MG/5ML liquid Take 5-10 mLs (100-200 mg total) by mouth every 4 (four) hours as needed for congestion. 06/27/15   Domenic Moras, PA-C  ibuprofen (ADVIL,MOTRIN) 800 MG tablet Take 1 tablet (800 mg total) by mouth 3 (three) times daily. 08/14/17   Larene Pickett, PA-C  promethazine-dextromethorphan (PROMETHAZINE-DM) 6.25-15 MG/5ML syrup Take 5 mLs by mouth 4 (four)  times daily as needed for cough. 06/27/15   Domenic Moras, PA-C    Allergies    Penicillins and Amoxicillin  Review of Systems   Review of Systems  Constitutional: Positive for chills. Negative for appetite change.  HENT: Negative for congestion.   Respiratory: Negative for cough.   Cardiovascular: Negative for chest pain.  Gastrointestinal: Positive for nausea. Negative for vomiting.  Genitourinary: Negative for flank pain.  Musculoskeletal: Positive for myalgias.  Skin: Negative for rash.  Neurological: Negative for weakness.  Psychiatric/Behavioral: Negative for confusion.    Physical Exam Updated Vital Signs BP 122/63   Pulse (!) 120   Temp 100 F (37.8 C) (Oral)   Resp 15   SpO2 (!) 89%   Physical Exam Vitals and nursing note reviewed.  HENT:     Head: Normocephalic.  Eyes:     Extraocular Movements: Extraocular movements intact.  Cardiovascular:     Rate and Rhythm: Tachycardia present.     Comments: Quiet systolic murmur Abdominal:     Tenderness: There is no abdominal tenderness.  Musculoskeletal:        General: No tenderness.     Cervical back: Neck supple.  Skin:    General: Skin is warm.     Capillary Refill: Capillary refill takes less than 2 seconds.  Neurological:  Mental Status: He is alert and oriented to person, place, and time.     ED Results / Procedures / Treatments   Labs (all labs ordered are listed, but only abnormal results are displayed) Labs Reviewed  COMPREHENSIVE METABOLIC PANEL - Abnormal; Notable for the following components:      Result Value   Glucose, Bld 117 (*)    Creatinine, Ser 1.25 (*)    Calcium 8.8 (*)    AST 61 (*)    ALT 46 (*)    All other components within normal limits  CBC WITH DIFFERENTIAL/PLATELET - Abnormal; Notable for the following components:   WBC 11.6 (*)    RBC 4.13 (*)    Neutro Abs 8.4 (*)    Monocytes Absolute 1.2 (*)    Abs Immature Granulocytes 0.15 (*)    All other components within  normal limits  URINALYSIS, ROUTINE W REFLEX MICROSCOPIC - Abnormal; Notable for the following components:   APPearance HAZY (*)    Hgb urine dipstick SMALL (*)    Protein, ur 30 (*)    Leukocytes,Ua LARGE (*)    WBC, UA >50 (*)    Bacteria, UA RARE (*)    All other components within normal limits  RAPID URINE DRUG SCREEN, HOSP PERFORMED - Abnormal; Notable for the following components:   Tetrahydrocannabinol POSITIVE (*)    All other components within normal limits  ETHANOL - Abnormal; Notable for the following components:   Alcohol, Ethyl (B) 73 (*)    All other components within normal limits  CULTURE, BLOOD (ROUTINE X 2)  CULTURE, BLOOD (ROUTINE X 2)  SARS CORONAVIRUS 2 (TAT 6-24 HRS)  LACTIC ACID, PLASMA  PROTIME-INR  LACTIC ACID, PLASMA  TROPONIN I (HIGH SENSITIVITY)    EKG EKG Interpretation  Date/Time:  Sunday September 10 2019 19:26:34 EDT Ventricular Rate:  131 PR Interval:    QRS Duration: 95 QT Interval:  303 QTC Calculation: 448 R Axis:   62 Text Interpretation: Sinus tachycardia Consider right atrial enlargement Consider right ventricular hypertrophy Borderline ST elevation, lateral leads Baseline wander in lead(s) I III aVL Confirmed by Benjiman Core [id number]) on 09/10/2019 8:36:01 PM   Radiology DG Chest Portable 1 View  Result Date: 09/10/2019 CLINICAL DATA:  Fever, cough EXAM: PORTABLE CHEST 1 VIEW COMPARISON:  None. FINDINGS: The heart size and mediastinal contours are within normal limits. Both lungs are clear. No pleural effusion. The visualized skeletal structures are unremarkable. IMPRESSION: No acute process in the chest. Electronically Signed   By: Guadlupe Spanish M.D.   On: 09/10/2019 20:03    Procedures Procedures (including critical care time)  Medications Ordered in ED Medications  nicotine (NICODERM CQ - dosed in mg/24 hours) patch 21 mg (21 mg Transdermal Patch Applied 09/10/19 2049)  LORazepam (ATIVAN) injection 1 mg (has no administration in  time range)  lactated ringers bolus 1,000 mL (0 mLs Intravenous Stopped 09/10/19 2325)  acetaminophen (TYLENOL) tablet 650 mg (650 mg Oral Given 09/10/19 2048)    ED Course  I have reviewed the triage vital signs and the nursing notes.  Pertinent labs & imaging results that were available during my care of the patient were reviewed by me and considered in my medical decision making (see chart for details).    MDM Rules/Calculators/A&P                      Patient presented after accidental overdose of heroin.  However states that he mostly drinks alcohol  heavily.  Feels that if he is withdrawing off alcohol some now also.  I do hear murmur on his cardiac exam.  Initially tachycardic.  Temperature 100 orally but refused rectal temperature.  Had also refused changing into gown.  Did feel warm however.  May have a component of alcohol withdrawal or potentially opiate withdrawal although opiates do not show up in his drug screen.  Does still have some hypoxia with sleeping.  With tachycardia potential fever and elevated white count along with a potentially new murmur to worry of endocarditis.  With alcohol drawl feel this if you would benefit from [place] to the hospital Final Clinical Impression(s) / ED Diagnoses Final diagnoses:  Accidental overdose of heroin, initial encounter [facility])  Alcohol withdrawal syndrome without complication (HCC)  Chills    Rx / DC Orders ED Discharge Orders    None       Benjiman Core, MD 09/10/19 2330

## 2019-09-10 NOTE — ED Notes (Signed)
Pt given hospital socks and ambulated to the restroom with a steady gait.

## 2019-09-10 NOTE — ED Notes (Signed)
Pt is ambulatory and removing monitor

## 2019-09-10 NOTE — ED Notes (Signed)
Pt speaking to himself and singing.

## 2019-09-10 NOTE — ED Triage Notes (Signed)
[place] [facility] [place] park with agonal respirations after using Heroin. Pt was administered 2mg  IN Narcan by fire. Pt is now A&Ox4, refused other treatments, however, came to the ED because he wants help with detox from Heroin. Pt is ambulatory with EMS and ED Staff.

## 2019-09-11 ENCOUNTER — Other Ambulatory Visit: Payer: Self-pay

## 2019-09-11 DIAGNOSIS — F1410 Cocaine abuse, uncomplicated: Secondary | ICD-10-CM | POA: Diagnosis present

## 2019-09-11 DIAGNOSIS — R5081 Fever presenting with conditions classified elsewhere: Secondary | ICD-10-CM

## 2019-09-11 DIAGNOSIS — A419 Sepsis, unspecified organism: Secondary | ICD-10-CM

## 2019-09-11 DIAGNOSIS — N390 Urinary tract infection, site not specified: Secondary | ICD-10-CM | POA: Diagnosis present

## 2019-09-11 DIAGNOSIS — F10231 Alcohol dependence with withdrawal delirium: Secondary | ICD-10-CM

## 2019-09-11 DIAGNOSIS — R509 Fever, unspecified: Secondary | ICD-10-CM | POA: Diagnosis present

## 2019-09-11 DIAGNOSIS — D72829 Elevated white blood cell count, unspecified: Secondary | ICD-10-CM

## 2019-09-11 LAB — CBC
HCT: 40.9 % (ref 39.0–52.0)
Hemoglobin: 13.3 g/dL (ref 13.0–17.0)
MCH: 32.6 pg (ref 26.0–34.0)
MCHC: 32.5 g/dL (ref 30.0–36.0)
MCV: 100.2 fL — ABNORMAL HIGH (ref 80.0–100.0)
Platelets: 232 10*3/uL (ref 150–400)
RBC: 4.08 MIL/uL — ABNORMAL LOW (ref 4.22–5.81)
RDW: 13.7 % (ref 11.5–15.5)
WBC: 10.3 10*3/uL (ref 4.0–10.5)
nRBC: 0 % (ref 0.0–0.2)

## 2019-09-11 LAB — COMPREHENSIVE METABOLIC PANEL
ALT: 47 U/L — ABNORMAL HIGH (ref 0–44)
AST: 57 U/L — ABNORMAL HIGH (ref 15–41)
Albumin: 3.7 g/dL (ref 3.5–5.0)
Alkaline Phosphatase: 57 U/L (ref 38–126)
Anion gap: 8 (ref 5–15)
BUN: 13 mg/dL (ref 6–20)
CO2: 29 mmol/L (ref 22–32)
Calcium: 8.7 mg/dL — ABNORMAL LOW (ref 8.9–10.3)
Chloride: 102 mmol/L (ref 98–111)
Creatinine, Ser: 1.04 mg/dL (ref 0.61–1.24)
GFR calc Af Amer: >60 mL/min (ref >60)
GFR calc non Af Amer: >60 mL/min (ref >60)
Glucose, Bld: 136 mg/dL — ABNORMAL HIGH (ref 70–99)
Potassium: 3.8 mmol/L (ref 3.5–5.1)
Sodium: 139 mmol/L (ref 135–145)
Total Bilirubin: 0.4 mg/dL (ref 0.3–1.2)
Total Protein: 7.4 g/dL (ref 6.5–8.1)

## 2019-09-11 LAB — PHOSPHORUS: Phosphorus: 4.4 mg/dL (ref 2.5–4.6)

## 2019-09-11 LAB — SARS CORONAVIRUS 2 (TAT 6-24 HRS): SARS Coronavirus 2: NEGATIVE

## 2019-09-11 LAB — MAGNESIUM: Magnesium: 2.1 mg/dL (ref 1.7–2.4)

## 2019-09-11 LAB — HIV ANTIBODY (ROUTINE TESTING W REFLEX): HIV Screen 4th Generation wRfx: NONREACTIVE

## 2019-09-11 MED ORDER — LORAZEPAM 2 MG/ML IJ SOLN
1.0000 mg | INTRAMUSCULAR | Status: DC | PRN
  Administered 2019-09-11: 04:00:00 1 mg via INTRAVENOUS
  Administered 2019-09-11 – 2019-09-12 (×5): 2 mg via INTRAVENOUS
  Filled 2019-09-11 (×6): qty 1

## 2019-09-11 MED ORDER — LORAZEPAM 1 MG PO TABS: 1.0000 mg | ORAL_TABLET | ORAL | Status: DC | PRN

## 2019-09-11 MED ORDER — FOLIC ACID 1 MG PO TABS
1.0000 mg | ORAL_TABLET | Freq: Every day | ORAL | Status: DC
  Administered 2019-09-11: 09:00:00 1 mg via ORAL
  Filled 2019-09-11 (×2): qty 1

## 2019-09-11 MED ORDER — HEPARIN SODIUM (PORCINE) 5000 UNIT/ML IJ SOLN
5000.0000 [IU] | Freq: Three times a day (TID) | INTRAMUSCULAR | Status: DC
  Administered 2019-09-11 – 2019-09-12 (×4): 5000 [IU] via SUBCUTANEOUS
  Filled 2019-09-11 (×4): qty 1

## 2019-09-11 MED ORDER — SODIUM CHLORIDE 0.9 % IV SOLN
2.0000 g | Freq: Three times a day (TID) | INTRAVENOUS | Status: DC
  Administered 2019-09-11 – 2019-09-12 (×4): 2 g via INTRAVENOUS
  Filled 2019-09-11 (×5): qty 2

## 2019-09-11 MED ORDER — THIAMINE HCL 100 MG PO TABS
100.0000 mg | ORAL_TABLET | Freq: Every day | ORAL | Status: DC
  Administered 2019-09-11: 09:00:00 100 mg via ORAL
  Filled 2019-09-11 (×2): qty 1

## 2019-09-11 MED ORDER — ADULT MULTIVITAMIN W/MINERALS CH
1.0000 | ORAL_TABLET | Freq: Every day | ORAL | Status: DC
  Administered 2019-09-11: 09:00:00 1 via ORAL
  Filled 2019-09-11 (×2): qty 1

## 2019-09-11 MED ORDER — ONDANSETRON HCL 4 MG/2ML IJ SOLN
4.0000 mg | Freq: Four times a day (QID) | INTRAMUSCULAR | Status: DC | PRN
  Administered 2019-09-11: 4 mg via INTRAVENOUS
  Filled 2019-09-11: qty 2

## 2019-09-11 MED ORDER — ONDANSETRON HCL 4 MG PO TABS: 4.0000 mg | ORAL_TABLET | Freq: Four times a day (QID) | ORAL | Status: DC | PRN

## 2019-09-11 MED ORDER — THIAMINE HCL 100 MG/ML IJ SOLN: 100.0000 mg | Freq: Every day | INTRAMUSCULAR | Status: DC

## 2019-09-11 MED ORDER — SODIUM CHLORIDE 0.9 % IV SOLN: INTRAVENOUS | Status: DC

## 2019-09-11 NOTE — Progress Notes (Signed)
Pharmacy Antibiotic Note  Matthew Reyes is a 29 y.o. male admitted on 09/10/2019 with sepsis.  Pharmacy has been consulted for cefepime dosing.  Plan: Cefepime 2 Gm IV q8h F/u scr/cultures     Temp (24hrs), Avg:98.8 F (37.1 C), Min:97.9 F (36.6 C), Max:100 F (37.8 C)  Recent Labs  Lab 09/10/19 2025  WBC 11.6*  CREATININE 1.25*  LATICACIDVEN 1.3    CrCl cannot be calculated (Unknown ideal weight.).    Allergies  Allergen Reactions  . Penicillins Hives  . Amoxicillin Rash    Has patient had a PCN reaction causing immediate rash, facial/tongue/throat swelling, SOB or lightheadedness with hypotension: YesHas patient had a PCN react ion causing severe rash involving mucus membranes or skin necrosis: U Has patient had a PCN reaction that required hospitalization: U Has patient had a PCN reaction occurring within the last 10 years: U If all of the above answers are "NO", then may proceed with Cephalosporin use.     Antimicrobials this admission: 4/5 cefepime >>    >>   Dose adjustments this admission:   Microbiology results:  BCx:   UCx:    Sputum:    MRSA PCR:  Thank you for allowing pharmacy to be a part of this patient's care.  Lorenza Evangelist 09/11/2019 12:37 AM

## 2019-09-11 NOTE — H&P (Signed)
History and Physical   Matthew Reyes ONG:[id number] DOB: [birth year] DOA: 09/10/2019  Referring MD/NP/PA: Dr. Rubin Payor  PCP: Patient, No Pcp Per   Outpatient Specialists: None  Patient coming from: Home  Chief Complaint: Drug overdose  HPI: Matthew Reyes is a 29 y.o. male with medical history significant of polysubstance abuse who presented to the ER after suspected heroin overdose.  Patient was found unresponsive after doing pink heroin.  He was given Narcan and brought to the ER.  Patient was noted to be shaking and said he has chills.  Generalized pain.  Patient has been getting help with withdrawal from alcohol.  He has been drinking heavily.  He is possibly an former IV drug abuser.  He has not been injecting lately.  He has been a heavy smoker mainly vape.  Smokeless cigarette.  Patient was seen in the ER evaluated and found to have fever leukocytosis and a heart murmur.  At this point he is suspected to have drug withdrawals with suspected endocarditis based on the murmur and the fever and leukocytosis.  She is being admitted to the hospital for further evaluation and treatment..  ED Course: Temperature is 100 blood pressure 107/88 pulse 133 respiratory rate of 27 oxygen sat 87% on room air.  White count is 11.6 hemoglobin 13.5 and platelets of 262.  Chemistry mostly negative except for AST 61 ALT 46 and creatinine 1.25.  Urine drug screen is positive for THC.  Alcohol level of 73 urinalysis showed large leukocytes nitrite negative.  WBC more than 50 with rare bacteria.  Chest x-ray shows no acute findings.  PT 12.2 INR 0.9.  Blood cultures obtained and patient being admitted with possible endocarditis but also alcohol intoxication with withdrawal.  Review of Systems: As per HPI otherwise 10 point review of systems negative.    Past Medical History:  Diagnosis Date  . Cocaine abuse (HCC)   . Dental caries   . ETOH abuse     History reviewed. No pertinent surgical  history.   reports that he has been smoking. He uses smokeless tobacco. He reports current alcohol use. He reports that he does not use drugs.  Allergies  Allergen Reactions  . Penicillins Hives  . Amoxicillin Rash    Has patient had a PCN reaction causing immediate rash, facial/tongue/throat swelling, SOB or lightheadedness with hypotension: YesHas patient had a PCN react ion causing severe rash involving mucus membranes or skin necrosis: U Has patient had a PCN reaction that required hospitalization: U Has patient had a PCN reaction occurring within the last 10 years: U If all of the above answers are "NO", then may proceed with Cephalosporin use.     History reviewed. No pertinent family history.   Prior to Admission medications   Medication Sig Start Date End Date Taking? Authorizing Provider  chlordiazePOXIDE (LIBRIUM) 25 MG capsule 50mg  PO TID x 1D, then 25-50mg  PO BID X 1D, then 25-50mg  PO QD X 1D Patient not taking: Reported on 09/10/2019 08/02/19   08/04/19, PA-C  guaiFENesin (ROBITUSSIN) 100 MG/5ML liquid Take 5-10 mLs (100-200 mg total) by mouth every 4 (four) hours as needed for congestion. Patient not taking: Reported on 09/10/2019 06/27/15   06/29/15, PA-C  ibuprofen (ADVIL,MOTRIN) 800 MG tablet Take 1 tablet (800 mg total) by mouth 3 (three) times daily. Patient not taking: Reported on 09/10/2019 08/14/17   10/14/17, PA-C  promethazine-dextromethorphan (PROMETHAZINE-DM) 6.25-15 MG/5ML syrup Take 5 mLs by mouth 4 (four) times  daily as needed for cough. Patient not taking: Reported on 09/10/2019 06/27/15   Domenic Moras, PA-C    Physical Exam: Vitals:   09/10/19 2345 09/10/19 2350 09/10/19 2352 09/11/19 0007  BP:  107/88  124/74  Pulse: (!) 114 100 90 99  Resp: (!) 22 16 (!) 27 18  Temp:   97.9 F (36.6 C) 98.4 F (36.9 C)  TempSrc:   Oral Oral  SpO2: 95% 96% 98% 98%      Constitutional: Drowsy and tremulous Vitals:   09/10/19 2345 09/10/19 2350 09/10/19  2352 09/11/19 0007  BP:  107/88  124/74  Pulse: (!) 114 100 90 99  Resp: (!) 22 16 (!) 27 18  Temp:   97.9 F (36.6 C) 98.4 F (36.9 C)  TempSrc:   Oral Oral  SpO2: 95% 96% 98% 98%   Eyes: PERRL, lids and conjunctivae normal ENMT: Mucous membranes are moist. Posterior pharynx clear of any exudate or lesions.Normal dentition.  Neck: normal, supple, no masses, no thyromegaly Respiratory: clear to auscultation bilaterally, no wheezing, no crackles. Normal respiratory effort. No accessory muscle use.  Cardiovascular: Sinus tachycardia, no murmurs / rubs / gallops. No extremity edema. 2+ pedal pulses. No carotid bruits.  Abdomen: no tenderness, no masses palpated. No hepatosplenomegaly. Bowel sounds positive.  Musculoskeletal: no clubbing / cyanosis. No joint deformity upper and lower extremities. Good ROM, no contractures. Normal muscle tone.  Skin: no rashes, lesions, ulcers. No induration Neurologic: CN 2-12 grossly intact. Sensation intact, DTR normal. Strength 5/5 in all 4.  Psychiatric: Drowsy but responsive.  Awake at the moment.     Labs on Admission: I have personally reviewed following labs and imaging studies  CBC: Recent Labs  Lab 09/10/19 2025  WBC 11.6*  NEUTROABS 8.4*  HGB 13.5  HCT 41.2  MCV 99.8  PLT 401   Basic Metabolic Panel: Recent Labs  Lab 09/10/19 2025  NA 142  K 3.9  CL 105  CO2 25  GLUCOSE 117*  BUN 13  CREATININE 1.25*  CALCIUM 8.8*   GFR: CrCl cannot be calculated (Unknown ideal weight.). Liver Function Tests: Recent Labs  Lab 09/10/19 2025  AST 61*  ALT 46*  ALKPHOS 67  BILITOT 0.5  PROT 7.4  ALBUMIN 3.6   No results for input(s): LIPASE, AMYLASE in the last 168 hours. No results for input(s): AMMONIA in the last 168 hours. Coagulation Profile: Recent Labs  Lab 09/10/19 2025  INR 0.9   Cardiac Enzymes: No results for input(s): CKTOTAL, CKMB, CKMBINDEX, TROPONINI in the last 168 hours. BNP (last 3 results) No results for  input(s): PROBNP in the last 8760 hours. HbA1C: No results for input(s): HGBA1C in the last 72 hours. CBG: No results for input(s): GLUCAP in the last 168 hours. Lipid Profile: No results for input(s): CHOL, HDL, LDLCALC, TRIG, CHOLHDL, LDLDIRECT in the last 72 hours. Thyroid Function Tests: No results for input(s): TSH, T4TOTAL, FREET4, T3FREE, THYROIDAB in the last 72 hours. Anemia Panel: No results for input(s): VITAMINB12, FOLATE, FERRITIN, TIBC, IRON, RETICCTPCT in the last 72 hours. Urine analysis:    Component Value Date/Time   COLORURINE YELLOW 09/10/2019 2025   APPEARANCEUR HAZY (A) 09/10/2019 2025   LABSPEC 1.011 09/10/2019 2025   PHURINE 6.0 09/10/2019 2025   GLUCOSEU NEGATIVE 09/10/2019 2025   HGBUR SMALL (A) 09/10/2019 2025   BILIRUBINUR NEGATIVE 09/10/2019 2025   KETONESUR NEGATIVE 09/10/2019 2025   PROTEINUR 30 (A) 09/10/2019 2025   UROBILINOGEN 1.0 11/21/2009 1839   NITRITE  NEGATIVE 09/10/2019 2025   LEUKOCYTESUR LARGE (A) 09/10/2019 2025   Sepsis Labs: @LABRCNTIP (procalcitonin:4,lacticidven:4) )No results found for this or any previous visit (from the past 240 hour(s)).   Radiological Exams on Admission: DG Chest Portable 1 View  Result Date: 09/10/2019 CLINICAL DATA:  Fever, cough EXAM: PORTABLE CHEST 1 VIEW COMPARISON:  None. FINDINGS: The heart size and mediastinal contours are within normal limits. Both lungs are clear. No pleural effusion. The visualized skeletal structures are unremarkable. IMPRESSION: No acute process in the chest. Electronically Signed   By: 11/10/2019 M.D.   On: 09/10/2019 20:03    EKG: Independently reviewed.  Shows sinus tachycardia with a rate of 130s.  No significant ST changes  Assessment/Plan Principal Problem:   Alcohol withdrawal (HCC) Active Problems:   Fever   Cocaine abuse (HCC)   Leucocytosis   Sepsis (HCC)   UTI (urinary tract infection)     #1 alcohol withdrawal: Patient may also be having withdrawal from  other drugs including heroin.  He did just have heroin although drug screen is negative.  We will admit the patient start CIWA protocol.  Watch for seizures and other withdrawal symptoms.  Counseling to be provided.  #2 sepsis by criteria: Patient has SIRS.  He appears to have UTI also.  That may account for his fever.  With his murmur will suspect bacteremia with possible endocarditis in the patient with drug abuse.  We will get the blood cultures and follow results.  Empirically start him on cefepime and add vancomycin.  #3 UTI: Patient is not symptomatic but based on urinalysis.  Again continue with empiric antibiotics and await urine culture sensitivity results.  #4 polysubstance abuse: THC only thing dictated today.  Counseling to be provided including nicotine patch if patient will accept.   DVT prophylaxis: Heparin Code Status: Full code Family Communication: No family at bedside Disposition Plan: To be determined Consults called: None Admission status: Inpatient  Severity of Illness: The appropriate patient status for this patient is INPATIENT. Inpatient status is judged to be reasonable and necessary in order to provide the required intensity of service to ensure the patient's safety. The patient's presenting symptoms, physical exam findings, and initial radiographic and laboratory data in the context of their chronic comorbidities is felt to place them at high risk for further clinical deterioration. Furthermore, it is not anticipated that the patient will be medically stable for discharge from the hospital within 2 midnights of admission. The following factors support the patient status of inpatient.   " The patient's presenting symptoms include altered mental status. " The worrisome physical exam findings include altered mental status. " The initial radiographic and laboratory data are worrisome because of evidence of UTI with drug screen positive. " The chronic co-morbidities  include drug abuse and alcohol abuse.   * I certify that at the point of admission it is my clinical judgment that the patient will require inpatient hospital care spanning beyond 2 midnights from the point of admission due to high intensity of service, high risk for further deterioration and high frequency of surveillance required.11/10/2019 MD [facility] Pager [phone]  If 7PM-7AM, please contact night-coverage [email or web address] Password [facility]  09/11/2019, 12:13 AM

## 2019-09-11 NOTE — Progress Notes (Signed)
PROGRESS NOTE    [name]  YTK:[id number] DOB: [birth year] DOA: 09/10/2019 PCP: Patient, No Pcp Per   Patient coming from: Home  Chief Complaint: Drug overdose  Brief Narrative: Matthew Reyes is a 29 y.o. male with medical history significant of polysubstance abuse who presented to the ER after suspected heroin overdose.  Patient was found unresponsive after doing pink heroin.  He was given Narcan and brought to the ER.  Patient was noted to be shaking and said he has chills.  Generalized pain.  Patient has been getting help with withdrawal from alcohol.  He has been drinking heavily.  He is possibly an former IV drug abuser.  He has not been injecting lately.  He has been a heavy smoker mainly vape.  Smokeless cigarette.  Patient was seen in the ER evaluated and found to have fever leukocytosis and a heart murmur.  At this point he is suspected to have drug withdrawals with suspected endocarditis based on the murmur and the fever and leukocytosis.  She is being admitted to the hospital for further evaluation and treatment. In ED: temperature is 100 blood pressure 107/88 pulse 133 respiratory rate of 27 oxygen sat 87% on room air.  White count is 11.6 hemoglobin 13.5 and platelets of 262.  Chemistry mostly negative except for AST 61 ALT 46 and creatinine 1.25.  Urine drug screen is positive for THC.  Alcohol level of 73 urinalysis showed large leukocytes nitrite negative.  WBC more than 50 with rare bacteria.  Chest x-ray shows no acute findings.  PT 12.2 INR 0.9.  Blood cultures obtained and patient being admitted with possible endocarditis but also alcohol intoxication with withdrawal.   Assessment & Plan:   Principal Problem:   Alcohol withdrawal ([place]) Active Problems:   Fever   Cocaine abuse ([place])   Leucocytosis   Sepsis ([place])   UTI (urinary tract infection)  Alcohol withdrawal: Patient may also be having withdrawal from other drugs including heroin.  He did just  have heroin although drug screen is negative.  We will admit the patient start CIWA protocol.  Watch for seizures and other withdrawal symptoms.  Counseling to be provided.  Sepsis by criteria:  Likely UTI, also ruling out endocarditis/bacteremia.  Which may account for his fever.   Continue cefepime and vancomycin. Cultures pending  Polysubstance abuse:  UDS positive for THC only; although newer narcotics may not register on our testing as patient was using specific type of heroin per report, responded appropriately to Narcan in the field  DVT prophylaxis: Heparin Code Status: Full code Family Communication: No family at bedside Disposition Plan: To be determined Consults called: None Admission status: Inpatient  Antimicrobials:  Cefepime, vancomycin as above   Subjective: No acute issues or events overnight, patient now more awake alert orientated feels quite well, denies headache, fevers, chills, nausea, vomiting, diarrhea, constipation.  Objective: Vitals:   09/10/19 2350 09/10/19 2352 09/11/19 0007 09/11/19 0449  BP: 107/88  124/74 119/67  Pulse: 100 90 99 85  Resp: 16 (!) 27 18 17   Temp:  97.9 F (36.6 C) 98.4 F (36.9 C) 98.4 F (36.9 C)  TempSrc:  Oral Oral Oral  SpO2: 96% 98% 98% 100%    Intake/Output Summary (Last 24 hours) at 09/11/2019 0718 Last data filed at 09/11/2019 0340 Gross per 24 hour  Intake 1266.68 ml  Output --  Net 1266.68 ml   There were no vitals filed for this visit.  Examination:  General:  Pleasantly  resting in bed, No acute distress. HEENT:  Normocephalic atraumatic.  Sclerae nonicteric, noninjected.  Extraocular movements intact bilaterally. Neck:  Without mass or deformity.  Trachea is midline. Lungs:  Clear to auscultate bilaterally without rhonchi, wheeze, or rales. Heart:  Regular rate and rhythm.  Without murmurs, rubs, or gallops. Abdomen:  Soft, nontender, nondistended.  Without guarding or rebound. Extremities: Without  cyanosis, clubbing, edema, or obvious deformity. Vascular:  Dorsalis pedis and posterior tibial pulses palpable bilaterally. Skin:  Warm and dry, no erythema, no ulcerations.   Data Reviewed: I have personally reviewed following labs and imaging studies  CBC: Recent Labs  Lab 09/10/19 2025 09/11/19 0118  WBC 11.6* 10.3  NEUTROABS 8.4*  --   HGB 13.5 13.3  HCT 41.2 40.9  MCV 99.8 100.2*  PLT 262 232   Basic Metabolic Panel: Recent Labs  Lab 09/10/19 2025 09/11/19 0118  NA 142 139  K 3.9 3.8  CL 105 102  CO2 25 29  GLUCOSE 117* 136*  BUN 13 13  CREATININE 1.25* 1.04  CALCIUM 8.8* 8.7*  MG  --  2.1  PHOS  --  4.4   GFR: CrCl cannot be calculated (Unknown ideal weight.). Liver Function Tests: Recent Labs  Lab 09/10/19 2025 09/11/19 0118  AST 61* 57*  ALT 46* 47*  ALKPHOS 67 57  BILITOT 0.5 0.4  PROT 7.4 7.4  ALBUMIN 3.6 3.7   No results for input(s): LIPASE, AMYLASE in the last 168 hours. No results for input(s): AMMONIA in the last 168 hours. Coagulation Profile: Recent Labs  Lab 09/10/19 2025  INR 0.9   Cardiac Enzymes: No results for input(s): CKTOTAL, CKMB, CKMBINDEX, TROPONINI in the last 168 hours. BNP (last 3 results) No results for input(s): PROBNP in the last 8760 hours. HbA1C: No results for input(s): HGBA1C in the last 72 hours. CBG: No results for input(s): GLUCAP in the last 168 hours. Lipid Profile: No results for input(s): CHOL, HDL, LDLCALC, TRIG, CHOLHDL, LDLDIRECT in the last 72 hours. Thyroid Function Tests: No results for input(s): TSH, T4TOTAL, FREET4, T3FREE, THYROIDAB in the last 72 hours. Anemia Panel: No results for input(s): VITAMINB12, FOLATE, FERRITIN, TIBC, IRON, RETICCTPCT in the last 72 hours. Sepsis Labs: Recent Labs  Lab 09/10/19 2025  LATICACIDVEN 1.3    Recent Results (from the past 240 hour(s))  Culture, blood (routine x 2)     Status: None (Preliminary result)   Collection Time: 09/10/19  8:25 PM    Specimen: BLOOD  Result Value Ref Range Status   Specimen Description BLOOD RIGHT ANTECUBITAL  Final   Special Requests   Final    BOTTLES DRAWN AEROBIC AND ANAEROBIC Blood Culture adequate volume Performed at [facility], [address] [place], [place] [address]    Culture PENDING  Incomplete   Report Status PENDING  Incomplete     Radiology Studies: DG Chest Portable 1 View  Result Date: 09/10/2019 CLINICAL DATA:  Fever, cough EXAM: PORTABLE CHEST 1 VIEW COMPARISON:  None. FINDINGS: The heart size and mediastinal contours are within normal limits. Both lungs are clear. No pleural effusion. The visualized skeletal structures are unremarkable. IMPRESSION: No acute process in the chest. Electronically Signed   By: Guadlupe Spanish M.D.   On: 09/10/2019 20:03   Scheduled Meds: . folic acid  1 mg Oral Daily  . heparin  5,000 Units Subcutaneous Q8H  . multivitamin with minerals  1 tablet Oral Daily  . nicotine  21 mg Transdermal Once  .  thiamine  100 mg Oral Daily   Or  . thiamine  100 mg Intravenous Daily   Continuous Infusions: . sodium chloride 100 mL/hr at 09/11/19 0128  . ceFEPime (MAXIPIME) IV 2 g (09/11/19 0130)     LOS: 1 day    Time spent: 45 minutes    Azucena Fallen, DO [facility]  If 7PM-7AM, please contact night-coverage [email or web address]  09/11/2019, 7:18 AM

## 2019-09-12 ENCOUNTER — Inpatient Hospital Stay (HOSPITAL_COMMUNITY): Payer: Self-pay

## 2019-09-12 DIAGNOSIS — F1410 Cocaine abuse, uncomplicated: Secondary | ICD-10-CM

## 2019-09-12 DIAGNOSIS — I38 Endocarditis, valve unspecified: Secondary | ICD-10-CM

## 2019-09-12 LAB — CBC
HCT: 46.2 % (ref 39.0–52.0)
Hemoglobin: 15.2 g/dL (ref 13.0–17.0)
MCH: 33 pg (ref 26.0–34.0)
MCHC: 32.9 g/dL (ref 30.0–36.0)
MCV: 100.2 fL — ABNORMAL HIGH (ref 80.0–100.0)
Platelets: 256 10*3/uL (ref 150–400)
RBC: 4.61 MIL/uL (ref 4.22–5.81)
RDW: 13.4 % (ref 11.5–15.5)
WBC: 7.4 10*3/uL (ref 4.0–10.5)
nRBC: 0 % (ref 0.0–0.2)

## 2019-09-12 LAB — COMPREHENSIVE METABOLIC PANEL
ALT: 38 U/L (ref 0–44)
AST: 54 U/L — ABNORMAL HIGH (ref 15–41)
Albumin: 3.5 g/dL (ref 3.5–5.0)
Alkaline Phosphatase: 64 U/L (ref 38–126)
Anion gap: 8 (ref 5–15)
BUN: 8 mg/dL (ref 6–20)
CO2: 26 mmol/L (ref 22–32)
Calcium: 9.1 mg/dL (ref 8.9–10.3)
Chloride: 105 mmol/L (ref 98–111)
Creatinine, Ser: 1.01 mg/dL (ref 0.61–1.24)
GFR calc Af Amer: >60 mL/min (ref >60)
GFR calc non Af Amer: >60 mL/min (ref >60)
Glucose, Bld: 94 mg/dL (ref 70–99)
Potassium: 4 mmol/L (ref 3.5–5.1)
Sodium: 139 mmol/L (ref 135–145)
Total Bilirubin: 0.7 mg/dL (ref 0.3–1.2)
Total Protein: 7.4 g/dL (ref 6.5–8.1)

## 2019-09-12 LAB — URINE CULTURE: Culture: <10000 — AB

## 2019-09-12 LAB — ECHOCARDIOGRAM COMPLETE

## 2019-09-12 NOTE — TOC Initial Note (Signed)
Transition of Care [facility]) - Initial/Assessment Note    Patient Details  Name: Matthew Reyes MRN: [id number] Date of Birth: July 17, 1990  [facility] CM/SW Contact:    Trish Mage, LCSW Phone Number: 09/12/2019, 1:44 PM  Clinical Narrative:   Matthew Reyes was seen in response to Dr consult for homelessness.  Found him dozing in room; arouseable and minimally willing to engage in conversation. When asked if there was anything I could help him with, he initially stated "I don't know," and when offered help for anything he might need at d/c, he stated he could use a bus pass as "I am stuck way out here on [address]."  When asked about life outside of the hospital, he stated "I'm homeless. I get up in the AM to panhandle, get enough money to drunk, go to bed and start over again the next day."  I offered referral to alcohol rehab, which I pointed out could address both the having no place to stay and the drinking, but he declined, saying that he does not feel ready to change his current routine.  I specifically told him about [facility], and he recoiled, stating he does not want to go back to that "hell hole."  "If I wanted to go to prison, I would kill someone to get there.  They don't even let you smoke there."  This was spoken as he reached into his pants and took a drag from his vape device.  No further needs identified. TOC will continue to follow during the course of hospitalization.                  Expected Discharge Plan: Home/Self Care Barriers to Discharge: No Barriers Identified   Patient Goals and CMS Choice        Expected Discharge Plan and Services Expected Discharge Plan: Home/Self Care       Living arrangements for the past 2 months: Homeless                                      Prior Living Arrangements/Services Living arrangements for the past 2 months: Homeless Lives with:: Self Patient language and need for interpreter reviewed::  Yes Do you feel safe going back to the place where you live?: Yes      Need for Family Participation in Patient Care: No (Comment) Care giver support system in place?: No (comment)   Criminal Activity/Legal Involvement Pertinent to Current Situation/Hospitalization: No - Comment as needed  Activities of Daily Living Home Assistive Devices/Equipment: None ADL Screening (condition at time of admission) Patient's cognitive ability adequate to safely complete daily activities?: Yes Is the patient deaf or have difficulty hearing?: No Does the patient have difficulty seeing, even when wearing glasses/contacts?: No Does the patient have difficulty concentrating, remembering, or making decisions?: No Patient able to express need for assistance with ADLs?: Yes Does the patient have difficulty dressing or bathing?: No Independently performs ADLs?: Yes (appropriate for developmental age) Does the patient have difficulty walking or climbing stairs?: No Weakness of Legs: None Weakness of Arms/Hands: None  Permission Sought/Granted                  Emotional Assessment Appearance:: Appears stated age Attitude/Demeanor/Rapport: Engaged Affect (typically observed): Irritable Orientation: : Oriented to Self, Oriented to Place, Oriented to Situation Alcohol / Substance Use: Alcohol Use, Illicit Drugs Psych Involvement:  No (comment)  Admission diagnosis:  Alcohol withdrawal (HCC) [F10.239] Chills [R68.83] Accidental overdose of heroin, initial encounter (HCC) [T40.1X1A] Alcohol withdrawal syndrome without complication (HCC) [F10.230] Patient Active Problem List   Diagnosis Date Noted  . Fever 09/11/2019  . Cocaine abuse (HCC) 09/11/2019  . Leucocytosis 09/11/2019  . Sepsis (HCC) 09/11/2019  . UTI (urinary tract infection) 09/11/2019  . Alcohol withdrawal (HCC) 09/10/2019   PCP:  Patient, No Pcp Per Pharmacy:   [id number] - [place], [place] - [address] AT [facility] [address] [place] [address] Phone: [phone] Fax: [phone]     Social Determinants of Health (SDOH) Interventions    Readmission Risk Interventions No flowsheet data found.

## 2019-09-12 NOTE — Discharge Summary (Signed)
DISCHARGE SUMMARY    Matthew Reyes  VOZ:[id number] DOB: 09/13/1990 DOA: 09/10/2019 PCP: Patient, No Pcp Per   Patient coming from: Home  Chief Complaint: Drug overdose  Brief Narrative: Matthew Reyes is a 29 y.o. male with medical history significant of polysubstance abuse who presented to the ER after suspected heroin overdose.  Patient was found unresponsive after doing pink heroin.  He was given Narcan and brought to the ER.  Patient was noted to be shaking and said he has chills.  Generalized pain.  Patient has been getting help with withdrawal from alcohol.  He has been drinking heavily.  He is possibly an former IV drug abuser.  He has not been injecting lately.  He has been a heavy smoker mainly vape.  Smokeless cigarette.  Patient was seen in the ER evaluated and found to have fever leukocytosis and a heart murmur.  At this point he is suspected to have drug withdrawals with suspected endocarditis based on the murmur and the fever and leukocytosis.  She is being admitted to the hospital for further evaluation and treatment. In ED: temperature is 100 blood pressure 107/88 pulse 133 respiratory rate of 27 oxygen sat 87% on room air.  White count is 11.6 hemoglobin 13.5 and platelets of 262.  Chemistry mostly negative except for AST 61 ALT 46 and creatinine 1.25.  Urine drug screen is positive for THC.  Alcohol level of 73 urinalysis showed large leukocytes nitrite negative.  WBC more than 50 with rare bacteria.  Chest x-ray shows no acute findings.  PT 12.2 INR 0.9. Blood cultures obtained and patient being admitted with possible endocarditis but also alcohol intoxication with withdrawal.  Patient left AMA on 09/12/19 - we discussed elevated risk of morbidity mortality given ongoing withdrawal from both alcohol and illicit substances, increased risk for infection and need for ongoing follow-up.  Assessment & Plan:   Principal Problem:   Alcohol withdrawal (HCC) Active  Problems:   Fever   Cocaine abuse (HCC)   Leucocytosis   Sepsis ([place])   UTI (urinary tract infection)   Acute alcohol withdrawal, improving Patient may also be having withdrawal from other drugs including heroin.   Although drug screen is negative patient admits to recent use of heroin.   Continue CIWA protocol -continues to score requiring IV Ativan around-the-clock.  Watch for seizures and other withdrawal symptoms. Counseling at bedside.  Questionable sepsis 2/2 UTI, POA Rule out concurrent endocarditis/bacteremia Patient remains without recurrent fever, leukocytosis, urine culture unremarkable We will hold antibiotics at this time, follow clinically very low threshold to restart or to repeat cultures but given negative findings unlikely to have overt infection Echocardiogram pending for further evaluation of newly reported cardiac murmur  Polysubstance abuse:  UDS positive for THC only; although newer narcotics may not register on our testing as patient was using specific type of heroin per report, responded appropriately to Narcan in the field  DVT prophylaxis: Heparin Code Status: Full code Family Communication: No family at bedside Disposition Plan: To be determined Consults called: None Admission status: Inpatient, continues to require IV Ativan given alcohol and polysubstance abuse withdrawal as above.  Is high risk for withdrawal and seizures.  Disposition likely to be somewhat limited given patient's homeless status, lack of insurance and need for safe disposition.  Case management working diligently on possible rehab location given patient's polysubstance abuse.  Antimicrobials:  Cefepime, vancomycin stopped 09/12/19   Subjective: No acute issues or events overnight, patient now more awake  alert orientated feels quite well, denies headache, fevers, chills, nausea, vomiting, diarrhea, constipation.  Objective: Vitals:   09/11/19 1202 09/11/19 2111 09/12/19 0611  09/12/19 1330  BP: 115/65 118/68 103/73 110/67  Pulse: 79 81 98 99  Resp: 15 18 16 16   Temp: 97.6 F (36.4 C) 98.5 F (36.9 C) 98.4 F (36.9 C) 98.5 F (36.9 C)  TempSrc: Oral Oral Oral Oral  SpO2: 96% 93% 96% 98%    Intake/Output Summary (Last 24 hours) at 09/12/2019 1553 Last data filed at 09/12/2019 1300 Gross per 24 hour  Intake 1251.52 ml  Output --  Net 1251.52 ml   There were no vitals filed for this visit.  Examination:  General:  Pleasantly resting in bed, No acute distress. HEENT:  Normocephalic atraumatic.  Sclerae nonicteric, noninjected.  Extraocular movements intact bilaterally. Neck:  Without mass or deformity.  Trachea is midline. Lungs:  Clear to auscultate bilaterally without rhonchi, wheeze, or rales. Heart:  Regular rate and rhythm.  Without murmurs, rubs, or gallops. Abdomen:  Soft, nontender, nondistended.  Without guarding or rebound. Extremities: Without cyanosis, clubbing, edema, or obvious deformity.  Resting tremor, mild bilateral upper extremities Vascular:  Dorsalis pedis and posterior tibial pulses palpable bilaterally. Skin:  Warm and dry, no erythema, no ulcerations.   Data Reviewed: I have personally reviewed following labs and imaging studies  CBC: Recent Labs  Lab 09/10/19 2025 09/11/19 0118 09/12/19 0526  WBC 11.6* 10.3 7.4  NEUTROABS 8.4*  --   --   HGB 13.5 13.3 15.2  HCT 41.2 40.9 46.2  MCV 99.8 100.2* 100.2*  PLT 262 232 256   Basic Metabolic Panel: Recent Labs  Lab 09/10/19 2025 09/11/19 0118 09/12/19 0526  NA 142 139 139  K 3.9 3.8 4.0  CL 105 102 105  CO2 25 29 26   GLUCOSE 117* 136* 94  BUN 13 13 8   CREATININE 1.25* 1.04 1.01  CALCIUM 8.8* 8.7* 9.1  MG  --  2.1  --   PHOS  --  4.4  --    GFR: CrCl cannot be calculated (Unknown ideal weight.). Liver Function Tests: Recent Labs  Lab 09/10/19 2025 09/11/19 0118 09/12/19 0526  AST 61* 57* 54*  ALT 46* 47* 38  ALKPHOS 67 57 64  BILITOT 0.5 0.4 0.7  PROT  7.4 7.4 7.4  ALBUMIN 3.6 3.7 3.5   No results for input(s): LIPASE, AMYLASE in the last 168 hours. No results for input(s): AMMONIA in the last 168 hours. Coagulation Profile: Recent Labs  Lab 09/10/19 2025  INR 0.9   Cardiac Enzymes: No results for input(s): CKTOTAL, CKMB, CKMBINDEX, TROPONINI in the last 168 hours. BNP (last 3 results) No results for input(s): PROBNP in the last 8760 hours. HbA1C: No results for input(s): HGBA1C in the last 72 hours. CBG: No results for input(s): GLUCAP in the last 168 hours. Lipid Profile: No results for input(s): CHOL, HDL, LDLCALC, TRIG, CHOLHDL, LDLDIRECT in the last 72 hours. Thyroid Function Tests: No results for input(s): TSH, T4TOTAL, FREET4, T3FREE, THYROIDAB in the last 72 hours. Anemia Panel: No results for input(s): VITAMINB12, FOLATE, FERRITIN, TIBC, IRON, RETICCTPCT in the last 72 hours. Sepsis Labs: Recent Labs  Lab 09/10/19 2025  LATICACIDVEN 1.3    Recent Results (from the past 240 hour(s))  Culture, blood (routine x 2)     Status: None (Preliminary result)   Collection Time: 09/10/19  8:25 PM   Specimen: BLOOD  Result Value Ref Range Status   Specimen Description  BLOOD RIGHT ANTECUBITAL  Final   Special Requests   Final    BOTTLES DRAWN AEROBIC AND ANAEROBIC Blood Culture adequate volume Performed at [facility], [address], [place], [place] [address]    Culture   Final    NO GROWTH 1 DAY Performed at [facility], [address], [place], [place] [address]    Report Status PENDING  Incomplete  Culture, blood (routine x 2)     Status: None (Preliminary result)   Collection Time: 09/10/19  8:26 PM   Specimen: BLOOD RIGHT HAND  Result Value Ref Range Status   Specimen Description   Final    BLOOD RIGHT HAND Performed at [facility], [address], [place], [place] [address]    Special Requests   Final    BOTTLES DRAWN AEROBIC AND ANAEROBIC Blood Culture  adequate volume Performed at [facility], [address], [place], [place] [address]    Culture   Final    NO GROWTH 1 DAY Performed at [facility], [address], [place], [place] [address]    Report Status PENDING  Incomplete  SARS CORONAVIRUS 2 (TAT 6-24 HRS) Nasopharyngeal Nasopharyngeal Swab     Status: None   Collection Time: 09/10/19 11:30 PM   Specimen: Nasopharyngeal Swab  Result Value Ref Range Status   SARS Coronavirus 2 NEGATIVE NEGATIVE Final    Comment: (NOTE) SARS-CoV-2 target nucleic acids are NOT DETECTED. The SARS-CoV-2 RNA is generally detectable in upper and lower respiratory specimens during the acute phase of infection. Negative results do not preclude SARS-CoV-2 infection, do not rule out co-infections with other pathogens, and should not be used as the sole basis for treatment or other patient management decisions. Negative results must be combined with clinical observations, patient history, and epidemiological information. The expected result is Negative. Fact Sheet for Patients: HairSlick.no Fact Sheet for Healthcare Providers: quierodirigir.com This test is not yet approved or cleared by the [place] FDA and  has been authorized for detection and/or diagnosis of SARS-CoV-2 by FDA under an Emergency Use Authorization (EUA). This EUA will remain  in effect (meaning this test can be used) for the duration of the COVID-19 declaration under Section 56 4(b)(1) of the Act, 21 U.S.C. section 360bbb-3(b)(1), unless the authorization is terminated or revoked sooner. Performed at [facility], [address] [place], [place] [address]   Urine culture     Status: Abnormal   Collection Time: 09/11/19 12:57 AM   Specimen: Urine, Random  Result Value Ref Range Status   Specimen Description   Final    URINE, RANDOM Performed at [facility], [address] [place], [place] [address]    Special Requests   Final    NONE Performed at [facility], [address], [place], [place] [address]    Culture (A)  Final    <10,000 COLONIES/mL INSIGNIFICANT GROWTH Performed at [facility], [address] [place], [place] [address]    Report Status 09/12/2019 FINAL  Final     Radiology Studies: DG Chest Portable 1 View  Result Date: 09/10/2019 CLINICAL DATA:  Fever, cough EXAM: PORTABLE CHEST 1 VIEW COMPARISON:  None. FINDINGS: The heart size and mediastinal contours are within normal limits. Both lungs are clear. No pleural effusion. The visualized skeletal structures are unremarkable. IMPRESSION: No acute process in the chest. Electronically Signed   By: Jackquline Berlin.D.  On: 09/10/2019 20:03   ECHOCARDIOGRAM COMPLETE  Result Date: 09/12/2019    ECHOCARDIOGRAM REPORT   Patient Name:   Matthew [facility] Date of Exam: 09/12/2019 Medical Rec #:  [id number]              Height:       68.0 in Accession #:    [id number]             Weight:       150.0 lb Date of Birth:  Oct 13, 1990               BSA:          1.809 m Patient Age:    29 years               BP:           116/65 mmHg Patient Gender: M                      HR:           79 bpm. Exam Location:  Inpatient Procedure: 2D Echo Indications:    murmur 786.2                  endocarditis I38  History:        Patient has no prior history of Echocardiogram examinations.                 Risk Factors:Current Smoker. ETOH Abuse. Cocaine Abuse. drug                 abuse.  Sonographer:    Celene Skeen RDCS (AE) Referring Phys: [id number] Azucena Fallen IMPRESSIONS  1. Left ventricular ejection fraction, by estimation, is 60 to 65%. The left ventricle has normal function. The left ventricle has no regional wall motion abnormalities. Left ventricular diastolic parameters were normal.  2. Right ventricular systolic function is normal. The right ventricular size is normal.  3. The  mitral valve is normal in structure. No evidence of mitral valve regurgitation. No evidence of mitral stenosis.  4. The aortic valve is normal in structure. Aortic valve regurgitation is not visualized. No aortic stenosis is present.  5. The inferior vena cava is normal in size with greater than 50% respiratory variability, suggesting right atrial pressure of 3 mmHg. Conclusion(s)/Recommendation(s): No evidence of valvular vegetations on this transthoracic echocardiogram. Would recommend a transesophageal echocardiogram to exclude infective endocarditis if clinically indicated. FINDINGS  Left Ventricle: Left ventricular ejection fraction, by estimation, is 60 to 65%. The left ventricle has normal function. The left ventricle has no regional wall motion abnormalities. The left ventricular internal cavity size was normal in size. There is  no left ventricular hypertrophy. Left ventricular diastolic parameters were normal. Normal left ventricular filling pressure. Right Ventricle: The right ventricular size is normal. No increase in right ventricular wall thickness. Right ventricular systolic function is normal. Left Atrium: Left atrial size was normal in size. Right Atrium: Right atrial size was normal in size. Pericardium: There is no evidence of pericardial effusion. Mitral Valve: The mitral valve is normal in structure. Normal mobility of the mitral valve leaflets. No evidence of mitral valve regurgitation. No evidence of mitral valve stenosis. Tricuspid Valve: The tricuspid valve is normal in structure. Tricuspid valve regurgitation is not demonstrated. No evidence of tricuspid stenosis. Aortic Valve: The aortic valve is normal in structure. Aortic valve regurgitation is not visualized. No aortic stenosis is present. Pulmonic Valve: The pulmonic  valve was normal in structure. Pulmonic valve regurgitation is not visualized. No evidence of pulmonic stenosis. Aorta: The aortic root is normal in size and structure.  Venous: The inferior vena cava is normal in size with greater than 50% respiratory variability, suggesting right atrial pressure of 3 mmHg. IAS/Shunts: No atrial level shunt detected by color flow Doppler.  LEFT VENTRICLE PLAX 2D LVIDd:         5.00 cm  Diastology LVIDs:         3.40 cm  LV e' lateral:   9.68 cm/s LV PW:         0.90 cm  LV E/e' lateral: 11.5 LV IVS:        0.80 cm  LV e' medial:    9.03 cm/s LVOT diam:     1.90 cm  LV E/e' medial:  12.3 LV SV:         34 LV SV Index:   19 LVOT Area:     2.84 cm  RIGHT VENTRICLE RV S prime:     14.60 cm/s TAPSE (M-mode): 1.9 cm LEFT ATRIUM           Index       RIGHT ATRIUM           Index LA diam:      3.50 cm 1.94 cm/m  RA Area:     13.00 cm LA Vol (A4C): 36.0 ml 19.91 ml/m RA Volume:   27.40 ml  15.15 ml/m  AORTIC VALVE LVOT Vmax:   69.10 cm/s LVOT Vmean:  47.000 cm/s LVOT VTI:    0.119 m  AORTA Ao Root diam: 3.10 cm MITRAL VALVE MV Area (PHT): 4.80 cm     SHUNTS MV Decel Time: 158 msec     Systemic VTI:  0.12 m MV E velocity: 111.00 cm/s  Systemic Diam: 1.90 cm MV A velocity: 49.40 cm/s MV E/A ratio:  2.25 Mihai Croitoru MD Electronically signed by Thurmon FairMihai Croitoru MD Signature Date/Time: 09/12/2019/1:40:28 PM    Final    Scheduled Meds: . folic acid  1 mg Oral Daily  . heparin  5,000 Units Subcutaneous Q8H  . multivitamin with minerals  1 tablet Oral Daily  . thiamine  100 mg Oral Daily   Or  . thiamine  100 mg Intravenous Daily   Continuous Infusions: . sodium chloride 100 mL/hr at 09/12/19 1310     LOS: 2 days    Time spent: 45 minutes  Azucena FallenWilliam C Natiya Seelinger, DO [facility]  If 7PM-7AM, please contact night-coverage [email or web address]  09/12/2019, 3:53 PM

## 2019-09-12 NOTE — Progress Notes (Signed)
Patient requesting to leave AMA at this time.  States he needs to get outside prior to it getting dark so that he can panhandle.  Patient advised of the dangers of leaving the hospital before his treatment has been completed but continues to insist that he is leaving.  MD aware.  AMA paper signed.

## 2019-09-12 NOTE — Clinical Social Work Note (Signed)
Attempted to interview patient.  He was sleeping and I was unable to awaken him.  Will attempt contact again later.

## 2019-09-12 NOTE — Progress Notes (Signed)
PROGRESS NOTE    Matthew Reyes  WPY:[id number] DOB: 03-06-1991 DOA: 09/10/2019 PCP: Patient, No Pcp Per   Patient coming from: Home  Chief Complaint: Drug overdose  Brief Narrative: Matthew Reyes is a 29 y.o. male with medical history significant of polysubstance abuse who presented to the ER after suspected heroin overdose.  Patient was found unresponsive after doing pink heroin.  He was given Narcan and brought to the ER.  Patient was noted to be shaking and said he has chills.  Generalized pain.  Patient has been getting help with withdrawal from alcohol.  He has been drinking heavily.  He is possibly an former IV drug abuser.  He has not been injecting lately.  He has been a heavy smoker mainly vape.  Smokeless cigarette.  Patient was seen in the ER evaluated and found to have fever leukocytosis and a heart murmur.  At this point he is suspected to have drug withdrawals with suspected endocarditis based on the murmur and the fever and leukocytosis.  She is being admitted to the hospital for further evaluation and treatment. In ED: temperature is 100 blood pressure 107/88 pulse 133 respiratory rate of 27 oxygen sat 87% on room air.  White count is 11.6 hemoglobin 13.5 and platelets of 262.  Chemistry mostly negative except for AST 61 ALT 46 and creatinine 1.25.  Urine drug screen is positive for THC.  Alcohol level of 73 urinalysis showed large leukocytes nitrite negative.  WBC more than 50 with rare bacteria.  Chest x-ray shows no acute findings.  PT 12.2 INR 0.9.  Blood cultures obtained and patient being admitted with possible endocarditis but also alcohol intoxication with withdrawal.   Assessment & Plan:   Principal Problem:   Alcohol withdrawal ([place]) Active Problems:   Fever   Cocaine abuse ([place])   Leucocytosis   Sepsis ([place])   UTI (urinary tract infection)  Acute alcohol withdrawal, improving Patient may also be having withdrawal from other drugs including  heroin.   Although drug screen is negative patient admits to recent use of heroin.   Continue CIWA protocol -continues to score requiring IV Ativan around-the-clock.  Watch for seizures and other withdrawal symptoms. Counseling at bedside.  Questionable sepsis 2/2 UTI, POA Rule out concurrent endocarditis/bacteremia Patient remains without recurrent fever, leukocytosis, urine culture unremarkable We will hold antibiotics at this time, follow clinically very low threshold to restart or to repeat cultures but given negative findings unlikely to have overt infection Echocardiogram pending for further evaluation of newly reported cardiac murmur  Polysubstance abuse:  UDS positive for THC only; although newer narcotics may not register on our testing as patient was using specific type of heroin per report, responded appropriately to Narcan in the field  DVT prophylaxis: Heparin Code Status: Full code Family Communication: No family at bedside Disposition Plan: To be determined Consults called: None Admission status: Inpatient, continues to require IV Ativan given alcohol and polysubstance abuse withdrawal as above.  Is high risk for withdrawal and seizures.  Disposition likely to be somewhat limited given patient's homeless status, lack of insurance and need for safe disposition.  Case management working diligently on possible rehab location given patient's polysubstance abuse.  Antimicrobials:  Cefepime, vancomycin stopped 09/12/19   Subjective: No acute issues or events overnight, patient now more awake alert orientated feels quite well, denies headache, fevers, chills, nausea, vomiting, diarrhea, constipation.  Objective: Vitals:   09/11/19 0822 09/11/19 1202 09/11/19 2111 09/12/19 0611  BP: 130/81 115/65 118/68  103/73  Pulse: 89 79 81 98  Resp: 18 15 18 16   Temp: 98.5 F (36.9 C) 97.6 F (36.4 C) 98.5 F (36.9 C) 98.4 F (36.9 C)  TempSrc: Oral Oral Oral Oral  SpO2: 100% 96%  93% 96%    Intake/Output Summary (Last 24 hours) at 09/12/2019 0708 Last data filed at 09/12/2019 0600 Gross per 24 hour  Intake 1006.52 ml  Output 1200 ml  Net -193.48 ml   There were no vitals filed for this visit.  Examination:  General:  Pleasantly resting in bed, No acute distress. HEENT:  Normocephalic atraumatic.  Sclerae nonicteric, noninjected.  Extraocular movements intact bilaterally. Neck:  Without mass or deformity.  Trachea is midline. Lungs:  Clear to auscultate bilaterally without rhonchi, wheeze, or rales. Heart:  Regular rate and rhythm.  Without murmurs, rubs, or gallops. Abdomen:  Soft, nontender, nondistended.  Without guarding or rebound. Extremities: Without cyanosis, clubbing, edema, or obvious deformity.  Resting tremor, mild bilateral upper extremities Vascular:  Dorsalis pedis and posterior tibial pulses palpable bilaterally. Skin:  Warm and dry, no erythema, no ulcerations.   Data Reviewed: I have personally reviewed following labs and imaging studies  CBC: Recent Labs  Lab 09/10/19 2025 09/11/19 0118 09/12/19 0526  WBC 11.6* 10.3 7.4  NEUTROABS 8.4*  --   --   HGB 13.5 13.3 15.2  HCT 41.2 40.9 46.2  MCV 99.8 100.2* 100.2*  PLT 262 232 256   Basic Metabolic Panel: Recent Labs  Lab 09/10/19 2025 09/11/19 0118 09/12/19 0526  NA 142 139 139  K 3.9 3.8 4.0  CL 105 102 105  CO2 25 29 26   GLUCOSE 117* 136* 94  BUN 13 13 8   CREATININE 1.25* 1.04 1.01  CALCIUM 8.8* 8.7* 9.1  MG  --  2.1  --   PHOS  --  4.4  --    GFR: CrCl cannot be calculated (Unknown ideal weight.). Liver Function Tests: Recent Labs  Lab 09/10/19 2025 09/11/19 0118 09/12/19 0526  AST 61* 57* 54*  ALT 46* 47* 38  ALKPHOS 67 57 64  BILITOT 0.5 0.4 0.7  PROT 7.4 7.4 7.4  ALBUMIN 3.6 3.7 3.5   No results for input(s): LIPASE, AMYLASE in the last 168 hours. No results for input(s): AMMONIA in the last 168 hours. Coagulation Profile: Recent Labs  Lab  09/10/19 2025  INR 0.9   Cardiac Enzymes: No results for input(s): CKTOTAL, CKMB, CKMBINDEX, TROPONINI in the last 168 hours. BNP (last 3 results) No results for input(s): PROBNP in the last 8760 hours. HbA1C: No results for input(s): HGBA1C in the last 72 hours. CBG: No results for input(s): GLUCAP in the last 168 hours. Lipid Profile: No results for input(s): CHOL, HDL, LDLCALC, TRIG, CHOLHDL, LDLDIRECT in the last 72 hours. Thyroid Function Tests: No results for input(s): TSH, T4TOTAL, FREET4, T3FREE, THYROIDAB in the last 72 hours. Anemia Panel: No results for input(s): VITAMINB12, FOLATE, FERRITIN, TIBC, IRON, RETICCTPCT in the last 72 hours. Sepsis Labs: Recent Labs  Lab 09/10/19 2025  LATICACIDVEN 1.3    Recent Results (from the past 240 hour(s))  Culture, blood (routine x 2)     Status: None (Preliminary result)   Collection Time: 09/10/19  8:25 PM   Specimen: BLOOD  Result Value Ref Range Status   Specimen Description BLOOD RIGHT ANTECUBITAL  Final   Special Requests   Final    BOTTLES DRAWN AEROBIC AND ANAEROBIC Blood Culture adequate volume Performed at [facility],  [address], [place], [place] [address]    Culture   Final    NO GROWTH 1 DAY Performed at [facility], [address], [place], [place] [address]    Report Status PENDING  Incomplete  Culture, blood (routine x 2)     Status: None (Preliminary result)   Collection Time: 09/10/19  8:26 PM   Specimen: BLOOD RIGHT HAND  Result Value Ref Range Status   Specimen Description   Final    BLOOD RIGHT HAND Performed at [facility], [address], [place], [place] [address]    Special Requests   Final    BOTTLES DRAWN AEROBIC AND ANAEROBIC Blood Culture adequate volume Performed at [facility], [address], [place], [place] [address]    Culture   Final    NO GROWTH 1 DAY Performed at [facility], [address]  [place], [place] [address]    Report Status PENDING  Incomplete  SARS CORONAVIRUS 2 (TAT 6-24 HRS) Nasopharyngeal Nasopharyngeal Swab     Status: None   Collection Time: 09/10/19 11:30 PM   Specimen: Nasopharyngeal Swab  Result Value Ref Range Status   SARS Coronavirus 2 NEGATIVE NEGATIVE Final    Comment: (NOTE) SARS-CoV-2 target nucleic acids are NOT DETECTED. The SARS-CoV-2 RNA is generally detectable in upper and lower respiratory specimens during the acute phase of infection. Negative results do not preclude SARS-CoV-2 infection, do not rule out co-infections with other pathogens, and should not be used as the sole basis for treatment or other patient management decisions. Negative results must be combined with clinical observations, patient history, and epidemiological information. The expected result is Negative. Fact Sheet for Patients: HairSlick.no Fact Sheet for Healthcare Providers: quierodirigir.com This test is not yet approved or cleared by the [place] FDA and  has been authorized for detection and/or diagnosis of SARS-CoV-2 by FDA under an Emergency Use Authorization (EUA). This EUA will remain  in effect (meaning this test can be used) for the duration of the COVID-19 declaration under Section 56 4(b)(1) of the Act, 21 U.S.C. section 360bbb-3(b)(1), unless the authorization is terminated or revoked sooner. Performed at [facility], [address], [place], [place] [address]      Radiology Studies: DG Chest Portable 1 View  Result Date: 09/10/2019 CLINICAL DATA:  Fever, cough EXAM: PORTABLE CHEST 1 VIEW COMPARISON:  None. FINDINGS: The heart size and mediastinal contours are within normal limits. Both lungs are clear. No pleural effusion. The visualized skeletal structures are unremarkable. IMPRESSION: No acute process in the chest. Electronically Signed   By: Guadlupe Spanish M.D.   On: 09/10/2019 20:03    Scheduled Meds: . folic acid  1 mg Oral Daily  . heparin  5,000 Units Subcutaneous Q8H  . multivitamin with minerals  1 tablet Oral Daily  . thiamine  100 mg Oral Daily   Or  . thiamine  100 mg Intravenous Daily   Continuous Infusions: . sodium chloride 100 mL/hr at 09/12/19 0224  . ceFEPime (MAXIPIME) IV 2 g (09/12/19 0226)     LOS: 2 days    Time spent: 45 minutes  Azucena Fallen, DO [facility]  If 7PM-7AM, please contact night-coverage [email or web address]  09/12/2019, 7:08 AM

## 2019-09-12 NOTE — Progress Notes (Signed)
  Echocardiogram 2D Echocardiogram has been performed.  Matthew Reyes 09/12/2019, 11:40 AM

## 2019-09-16 LAB — CULTURE, BLOOD (ROUTINE X 2)
Culture: NO GROWTH
Culture: NO GROWTH
Special Requests: ADEQUATE
Special Requests: ADEQUATE

## 2019-09-26 ENCOUNTER — Ambulatory Visit (HOSPITAL_COMMUNITY)
Admission: RE | Admit: 2019-09-26 | Discharge: 2019-09-26 | Disposition: A | Discharge status: Home / Self Care | Payer: Self-pay | Attending: Psychiatry | Admitting: Psychiatry

## 2019-09-26 ENCOUNTER — Encounter (HOSPITAL_COMMUNITY): Payer: Self-pay

## 2019-09-26 ENCOUNTER — Emergency Department (HOSPITAL_COMMUNITY)
Admission: EM | Admit: 2019-09-26 | Discharge: 2019-09-27 | Disposition: A | Discharge status: Home / Self Care | Payer: Self-pay | Attending: Emergency Medicine | Admitting: Emergency Medicine

## 2019-09-26 ENCOUNTER — Emergency Department (HOSPITAL_COMMUNITY)
Admission: EM | Admit: 2019-09-26 | Discharge: 2019-09-26 | Disposition: A | Discharge status: Home / Self Care | Payer: Self-pay | Attending: Emergency Medicine | Admitting: Emergency Medicine

## 2019-09-26 ENCOUNTER — Other Ambulatory Visit: Payer: Self-pay

## 2019-09-26 DIAGNOSIS — F1510 Other stimulant abuse, uncomplicated: Secondary | ICD-10-CM | POA: Insufficient documentation

## 2019-09-26 DIAGNOSIS — K1370 Unspecified lesions of oral mucosa: Secondary | ICD-10-CM | POA: Insufficient documentation

## 2019-09-26 DIAGNOSIS — K029 Dental caries, unspecified: Secondary | ICD-10-CM | POA: Insufficient documentation

## 2019-09-26 DIAGNOSIS — F1210 Cannabis abuse, uncomplicated: Secondary | ICD-10-CM | POA: Insufficient documentation

## 2019-09-26 DIAGNOSIS — F1910 Other psychoactive substance abuse, uncomplicated: Secondary | ICD-10-CM | POA: Insufficient documentation

## 2019-09-26 DIAGNOSIS — R110 Nausea: Secondary | ICD-10-CM | POA: Insufficient documentation

## 2019-09-26 DIAGNOSIS — F17210 Nicotine dependence, cigarettes, uncomplicated: Secondary | ICD-10-CM | POA: Insufficient documentation

## 2019-09-26 MED ORDER — CLINDAMYCIN HCL 300 MG PO CAPS: 300.0000 mg | ORAL_CAPSULE | Freq: Three times a day (TID) | ORAL | 0 refills | Status: AC

## 2019-09-26 MED ORDER — LIDOCAINE VISCOUS HCL 2 % MT SOLN
15.0000 mL | Freq: Once | OROMUCOSAL | Status: AC
  Administered 2019-09-26: 15 mL via ORAL
  Filled 2019-09-26: qty 15

## 2019-09-26 MED ORDER — ALUM & MAG HYDROXIDE-SIMETH 200-200-20 MG/5ML PO SUSP
30.0000 mL | Freq: Once | ORAL | Status: AC
  Administered 2019-09-26: 30 mL via ORAL
  Filled 2019-09-26: qty 30

## 2019-09-26 NOTE — Discharge Instructions (Addendum)
Take Clindamycin as prescribed and complete the full course. Rinse with listerine or salt water after every meal.  Follow up with a dentist as soon as possible.

## 2019-09-26 NOTE — ED Provider Notes (Signed)
[facility]-EMERGENCY DEPT Provider Note   CSN: [id number] Arrival date & time: 09/26/19  1345     History Chief Complaint  Patient presents with  . Nausea  . Dental Pain    Matthew Reyes is a 29 y.o. male.  29yo male with complaint of feeling like the skin inside his mouth is peeling after drinking liquor, doing "ice" and marijuana today. Agreeable with poor dental hygiene and pain to anterior teeth. Patient states "it doesn't even matter, I'm going to jail anyway." Does not elaborate. No other complaints today.        Past Medical History:  Diagnosis Date  . Cocaine abuse ([facility])   . Dental caries   . ETOH abuse     Patient Active Problem List   Diagnosis Date Noted  . Fever 09/11/2019  . Cocaine abuse ([facility]) 09/11/2019  . Leucocytosis 09/11/2019  . Sepsis ([facility]) 09/11/2019  . UTI (urinary tract infection) 09/11/2019  . Alcohol withdrawal ([facility]) 09/10/2019    History reviewed. No pertinent surgical history.     History reviewed. No pertinent family history.  Social History   Tobacco Use  . Smoking status: Current Every Day Smoker  . Smokeless tobacco: Current User  Substance Use Topics  . Alcohol use: Yes    Comment: half a gallon a day   . Drug use: Yes    Types: Marijuana, Methamphetamines    Comment: in rehab    Home Medications Prior to Admission medications   Medication Sig Start Date End Date Taking? Authorizing Provider  chlordiazePOXIDE (LIBRIUM) 25 MG capsule 50mg  PO TID x 1D, then 25-50mg  PO BID X 1D, then 25-50mg  PO QD X 1D Patient not taking: Reported on 09/10/2019 08/02/19   08/04/19 A, PA-C  clindamycin (CLEOCIN) 300 MG capsule Take 1 capsule (300 mg total) by mouth 3 (three) times daily for 10 days. 09/26/19 10/06/19  10/08/19, PA-C  guaiFENesin (ROBITUSSIN) 100 MG/5ML liquid Take 5-10 mLs (100-200 mg total) by mouth every 4 (four) hours as needed for congestion. Patient not taking: Reported on 09/10/2019  06/27/15   06/29/15, PA-C  ibuprofen (ADVIL,MOTRIN) 800 MG tablet Take 1 tablet (800 mg total) by mouth 3 (three) times daily. Patient not taking: Reported on 09/10/2019 08/14/17   10/14/17, PA-C  promethazine-dextromethorphan (PROMETHAZINE-DM) 6.25-15 MG/5ML syrup Take 5 mLs by mouth 4 (four) times daily as needed for cough. Patient not taking: Reported on 09/10/2019 06/27/15   06/29/15, PA-C    Allergies    Penicillins and Amoxicillin  Review of Systems   Review of Systems  Constitutional: Negative for fever.  HENT: Positive for dental problem and mouth sores. Negative for facial swelling.   Respiratory: Negative for shortness of breath.   Cardiovascular: Negative for chest pain.  Gastrointestinal: Negative for abdominal pain.  Skin: Negative for wound.  All other systems reviewed and are negative.   Physical Exam Updated Vital Signs BP (!) 151/94 (BP Location: Right Arm)   Pulse (!) 122   Temp 99.8 F (37.7 C) (Oral)   Resp 18   Ht 5\' 8"  (1.727 m)   Wt 68 kg   SpO2 100%   BMI 22.81 kg/m   Physical Exam Vitals and nursing note reviewed.  Constitutional:      General: He is not in acute distress.    Appearance: He is well-developed. He is not diaphoretic.  HENT:     Head: Normocephalic and atraumatic.     Jaw:  No trismus.     Mouth/Throat:     Mouth: Mucous membranes are moist.     Pharynx: No oropharyngeal exudate or posterior oropharyngeal erythema.     Comments: Poor dental hygiene, no obvious abscess, significant tooth decay. Eyes:     Conjunctiva/sclera: Conjunctivae normal.  Pulmonary:     Effort: Pulmonary effort is normal.  Musculoskeletal:     Cervical back: Neck supple.  Lymphadenopathy:     Cervical: No cervical adenopathy.  Skin:    General: Skin is warm and dry.     Findings: No erythema or rash.  Neurological:     Mental Status: He is alert and oriented to person, place, and time.  Psychiatric:        Behavior: Behavior normal.      ED Results / Procedures / Treatments   Labs (all labs ordered are listed, but only abnormal results are displayed) Labs Reviewed - No data to display  EKG None  Radiology No results found.  Procedures Procedures (including critical care time)  Medications Ordered in ED Medications  alum & mag hydroxide-simeth (MAALOX/MYLANTA) 200-200-20 MG/5ML suspension 30 mL (has no administration in time range)    And  lidocaine (XYLOCAINE) 2 % viscous mouth solution 15 mL (has no administration in time range)    ED Course  I have reviewed the triage vital signs and the nursing notes.  Pertinent labs & imaging results that were available during my care of the patient were reviewed by me and considered in my medical decision making (see chart for details).  Clinical Course as of Sep 25 1713  Tue Sep 26, 2019  1714 29yo male brough tin by EMS, per EMS, vomiting after etoh and drug use today. Patient denies, states the inside of his mouth is peeling and no one believes him. Found to have very poor dental hygiene, no obvious abscess, no oral lesions noted. Will give GI cocktail for mouth discomfort, given rx for clindamycin for dental infection and referral to dentist.    [LM]    Clinical Course User Index [LM] Roque Lias   MDM Rules/Calculators/A&P                      Final Clinical Impression(s) / ED Diagnoses Final diagnoses:  Dental caries    Rx / DC Orders ED Discharge Orders         Ordered    clindamycin (CLEOCIN) 300 MG capsule  3 times daily     09/26/19 1713           Tacy Learn, PA-C 09/26/19 1716    Milton Ferguson, MD 09/26/19 2231

## 2019-09-26 NOTE — ED Triage Notes (Signed)
Patient arrived with [facility] who states he was at behavior health for drug use. Denies any drug or alcohol use in the last three days. EMS stating he is saying "I am trying to cough up the skin in my throat."

## 2019-09-26 NOTE — ED Triage Notes (Addendum)
Per EMS- Patient c/o dental pain and dry heaves. Patient reports that he drank 2 16 ounce margaritas and smoked weed.  Patient had a knife, hatchet, and macheti in his back pack. Weapons were given t security  Patient states he also had meth prior to calling EMS.

## 2019-09-26 NOTE — ED Notes (Signed)
Backpack given to security.

## 2019-09-27 MED ORDER — LORAZEPAM 1 MG PO TABS
1.0000 mg | ORAL_TABLET | Freq: Once | ORAL | Status: AC
  Administered 2019-09-27: 1 mg via ORAL
  Filled 2019-09-27: qty 1

## 2019-09-27 NOTE — Discharge Instructions (Addendum)
You were seen today with concerns for sloughing of the skin in your mouth.  There is no evidence of this on exam.  You seem agitated and this may be related to your polysubstance abuse.  See resources provided.

## 2019-09-27 NOTE — ED Notes (Signed)
Pt rested comfortably until busses started running. Pt ambulated out of ED under his own power. Back pack and belongings returned. Pt given coffee.

## 2019-09-27 NOTE — H&P (Signed)
Behavioral Health Medical Screening Exam  Matthew Reyes is an 29 y.o. male who was presents as a walk-in brought in by [employer] due to wandering the streets. Pt reports he is homeless and was just discharged from [facility] today and has no place to go. Pt reports he smoked marijuana 3 days ago and has been feeling like the skin inside his mouth is peeling. Pt states "i'm trying to cough up my skin in my mouth". Pt reports he is a daily drinker and drinks 2 160z beers, last use was this morning. Pt denies SI, HI, self harm and AVH. Pt's BP is 159/100. Pt is anxious with a pressured speech. Pt will be sent over to [facility] for medical clearance.  Total Time spent with patient: 30 minutes  Psychiatric Specialty Exam: Physical Exam  Constitutional: He is oriented to person, place, and time. He appears well-developed and well-nourished.  HENT:  Head: Normocephalic.  Eyes: Pupils are equal, round, and reactive to light.  Respiratory: Effort normal.  Musculoskeletal:        General: Normal range of motion.     Cervical back: Normal range of motion.  Neurological: He is alert and oriented to person, place, and time.  Skin: Skin is warm and dry.  Psychiatric: His behavior is normal. Thought content normal. His mood appears anxious. His speech is rapid and/or pressured. Cognition and memory are normal. He expresses impulsivity. He exhibits a depressed mood.    Review of Systems  There were no vitals taken for this visit.There is no height or weight on file to calculate BMI.  General Appearance: Disheveled  Eye Contact:  Fair  Speech:  Pressured  Volume:  Decreased  Mood:  Anxious  Affect:  Congruent  Thought Process:  Coherent and Descriptions of Associations: Intact  Orientation:  Full (Time, Place, and Person)  Thought Content:  WDL  Suicidal Thoughts:  No  Homicidal Thoughts:  No  Memory:  Recent;   Good  Judgement:  Fair  Insight:  Fair  Psychomotor Activity:  Increased and Restlessness   Concentration: Concentration: Good  Recall:  Good  Fund of Knowledge:Good  Language: Good  Akathisia:  No  Handed:  Right  AIMS (if indicated):     Assets:  Communication Skills Physical Health  Sleep:       Musculoskeletal: Strength & Muscle Tone: within normal limits Gait & Station: normal Patient leans: N/A  Disposition: No evidence of imminent risk to self or others at present.   Patient does not meet criteria for psychiatric inpatient admission. Supportive therapy provided about ongoing stressors. Recommendations:  Based on my evaluation the patient does not appear to have an emergency medical condition.  Wandra Arthurs, NP 09/27/2019, 12:05 AM

## 2019-09-27 NOTE — ED Provider Notes (Signed)
[facility]-EMERGENCY DEPT Provider Note   CSN: [id number] Arrival date & time: 09/26/19  2345     History Chief Complaint  Patient presents with  . Hypertension  . Medical Clearance    Matthew Reyes is a 29 y.o. male.  HPI     This is a 29 year old male with history of polysubstance abuse who presents from behavioral health.  He was seen and evaluated earlier yesterday evening found to have dental caries.  He was discharged home.  He was found by police in the parking lot wandering and looking in cars.  He was taken to behavioral health.  At that time he was noted to have elevated blood pressure so was sent here.  Patient states he is here because he feels like the skin inside of his mouth is "peeling."  He is very anxious.  He reports his last methamphetamine use was 3 days ago but he drank liquor, did "ice" and use marijuana just yesterday.  He is not homicidal or suicidal.  Denies pain.  He is very anxious regarding his mouth symptoms.  Patient is homeless.  Past Medical History:  Diagnosis Date  . Cocaine abuse (HCC)   . Dental caries   . ETOH abuse     Patient Active Problem List   Diagnosis Date Noted  . Fever 09/11/2019  . Cocaine abuse (HCC) 09/11/2019  . Leucocytosis 09/11/2019  . Sepsis (HCC) 09/11/2019  . UTI (urinary tract infection) 09/11/2019  . Alcohol withdrawal (HCC) 09/10/2019    No past surgical history on file.     No family history on file.  Social History   Tobacco Use  . Smoking status: Current Every Day Smoker  . Smokeless tobacco: Current User  Substance Use Topics  . Alcohol use: Yes    Comment: half a gallon a day   . Drug use: Yes    Types: Marijuana, Methamphetamines    Comment: in rehab    Home Medications Prior to Admission medications   Medication Sig Start Date End Date Taking? Authorizing Provider  chlordiazePOXIDE (LIBRIUM) 25 MG capsule 50mg  PO TID x 1D, then 25-50mg  PO BID X 1D, then  25-50mg  PO QD X 1D Patient not taking: Reported on 09/10/2019 08/02/19   08/04/19 A, PA-C  clindamycin (CLEOCIN) 300 MG capsule Take 1 capsule (300 mg total) by mouth 3 (three) times daily for 10 days. 09/26/19 10/06/19  10/08/19, PA-C  guaiFENesin (ROBITUSSIN) 100 MG/5ML liquid Take 5-10 mLs (100-200 mg total) by mouth every 4 (four) hours as needed for congestion. Patient not taking: Reported on 09/10/2019 06/27/15   06/29/15, PA-C  ibuprofen (ADVIL,MOTRIN) 800 MG tablet Take 1 tablet (800 mg total) by mouth 3 (three) times daily. Patient not taking: Reported on 09/10/2019 08/14/17   10/14/17, PA-C  promethazine-dextromethorphan (PROMETHAZINE-DM) 6.25-15 MG/5ML syrup Take 5 mLs by mouth 4 (four) times daily as needed for cough. Patient not taking: Reported on 09/10/2019 06/27/15   06/29/15, PA-C    Allergies    Penicillins and Amoxicillin  Review of Systems   Review of Systems  Constitutional: Negative for fever.  HENT: Positive for dental problem and mouth sores.   Respiratory: Negative for shortness of breath.   Cardiovascular: Negative for chest pain.  Gastrointestinal: Negative for abdominal pain, nausea and vomiting.  Psychiatric/Behavioral: Positive for agitation. The patient is nervous/anxious.   All other systems reviewed and are negative.   Physical Exam Updated Vital Signs BP Fayrene Helper)  157/87 (BP Location: Left Arm)   Pulse (!) 121   Temp 99.5 F (37.5 C) (Oral)   Resp 20   Ht 1.727 m (5\' 8" )   Wt 68 kg   SpO2 99%   BMI 22.81 kg/m   Physical Exam Vitals and nursing note reviewed.  Constitutional:      Appearance: He is well-developed.     Comments: Anxious appearing and agitated, disheveled  HENT:     Head: Normocephalic and atraumatic.     Mouth/Throat:     Mouth: Mucous membranes are moist.     Comments: Multiple dental caries, no sloughing of the mucosa noted in the mouth or on the soft or hard palate Eyes:     Pupils: Pupils are equal, round, and  reactive to light.  Cardiovascular:     Rate and Rhythm: Regular rhythm. Tachycardia present.     Heart sounds: Normal heart sounds. No murmur.  Pulmonary:     Effort: Pulmonary effort is normal. No respiratory distress.     Breath sounds: Normal breath sounds.  Abdominal:     Palpations: Abdomen is soft.     Tenderness: There is no abdominal tenderness.  Musculoskeletal:     Cervical back: Neck supple.     Right lower leg: No edema.     Left lower leg: No edema.  Lymphadenopathy:     Cervical: No cervical adenopathy.  Skin:    General: Skin is warm and dry.  Neurological:     Mental Status: He is alert and oriented to person, place, and time.  Psychiatric:     Comments: Agitated and anxious appearing     ED Results / Procedures / Treatments   Labs (all labs ordered are listed, but only abnormal results are displayed) Labs Reviewed - No data to display  EKG None  Radiology No results found.  Procedures Procedures (including critical care time)  Medications Ordered in ED Medications  LORazepam (ATIVAN) tablet 1 mg (1 mg Oral Given 09/27/19 0047)    ED Course  I have reviewed the triage vital signs and the nursing notes.  Pertinent labs & imaging results that were available during my care of the patient were reviewed by me and considered in my medical decision making (see chart for details).    MDM Rules/Calculators/A&P                       Patient presents concern for some skin sloughing in his mouth.  He was brought over from behavioral health.  History of polysubstance abuse.  Only vital sign abnormality is tachycardia in the 120s.  He was tachycardic on his prior evaluation as well.  Suspect this is related to acute ingestion and substance abuse.  I see no physical exam findings consistent with disruption of the oral mucosa.  Again wonder if this may be related to current substance abuse.  Patient was given 1 mg of Ativan and observed.  After observation, he  did seem to become less agitated with the Ativan.  He is not homicidal or suicidal.  He will be given resources for drug counseling and rehab.  After history, exam, and medical workup I feel the patient has been appropriately medically screened and is safe for discharge home. Pertinent diagnoses were discussed with the patient. Patient was given return precautions.   Final Clinical Impression(s) / ED Diagnoses Final diagnoses:  Polysubstance abuse ([place])    Rx / DC Orders ED Discharge Orders  None       Shon Baton, MD 09/27/19 [phone]

## 2020-02-07 DEATH — deceased

## 2020-10-28 IMAGING — DX DG HIP (WITH OR WITHOUT PELVIS) 1V PORT*R*
2 series · 2 of 2 positions shown · non-contrast
Comparison: None.

CLINICAL DATA: Status post trauma.

EXAM:
DG HIP (WITH OR WITHOUT PELVIS) 1V PORT RIGHT

[pelvis]
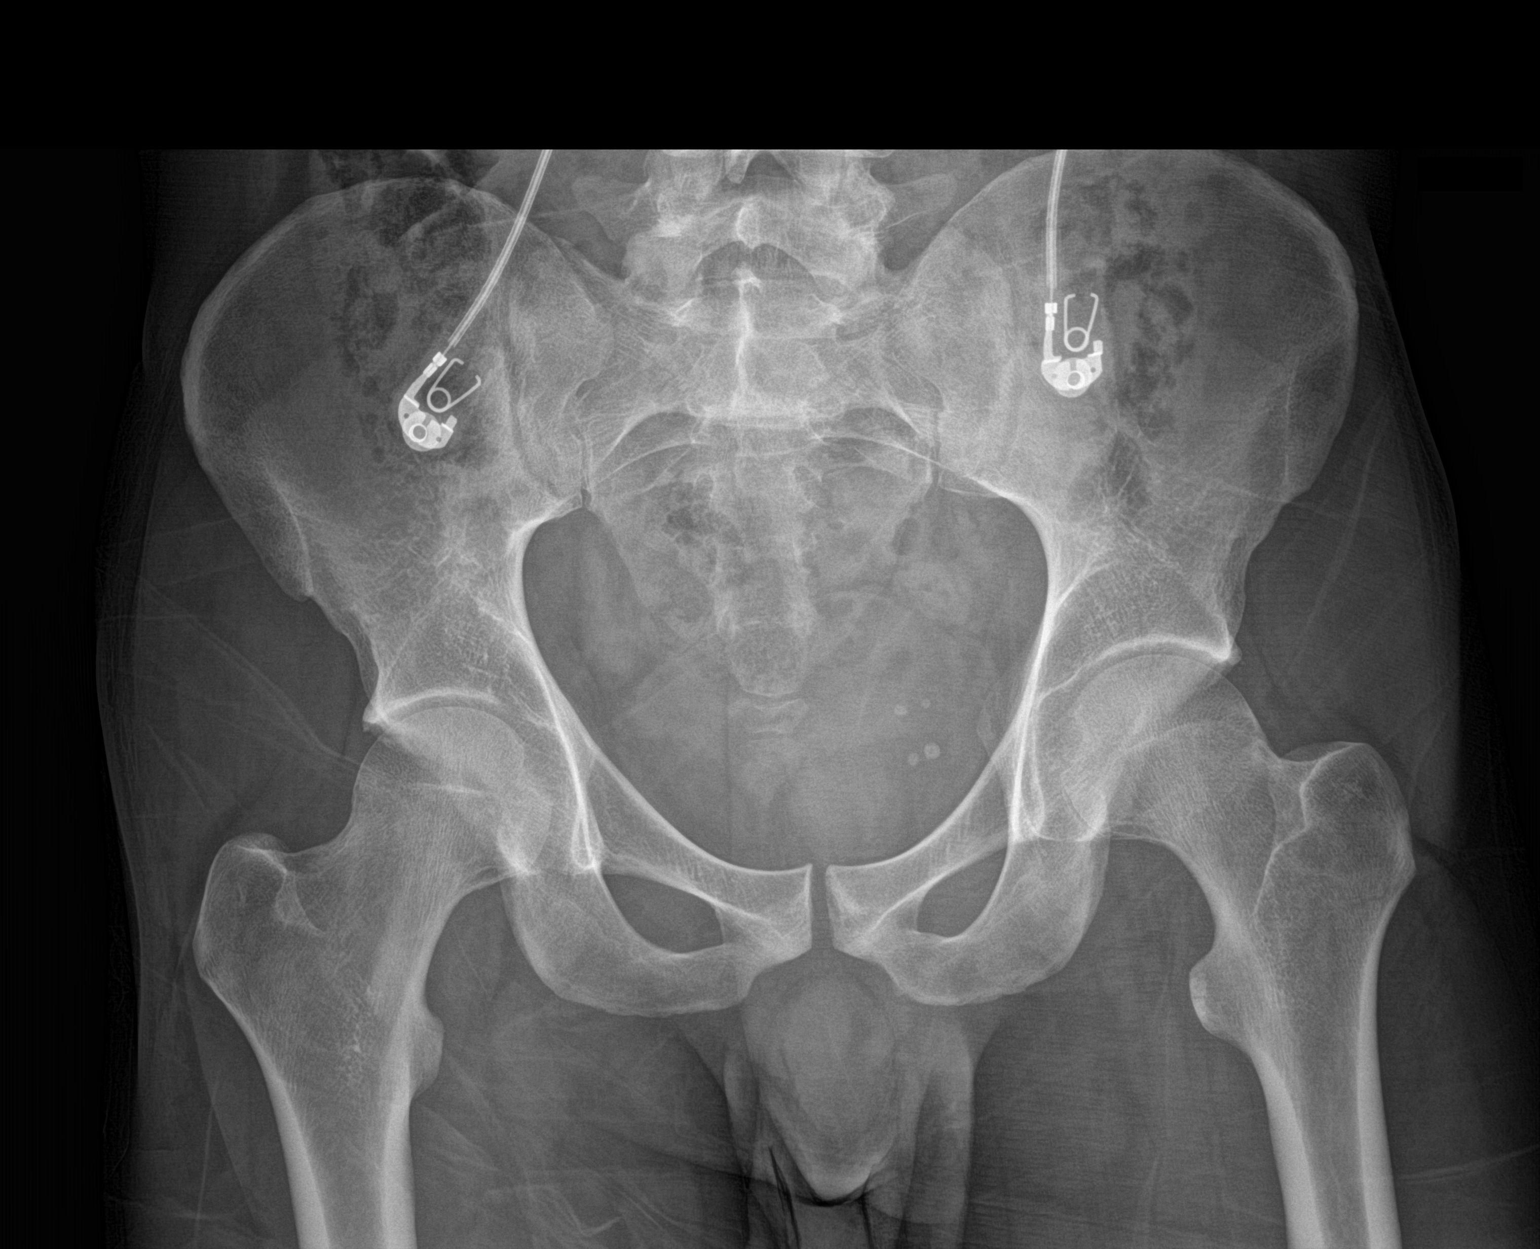

[hip]
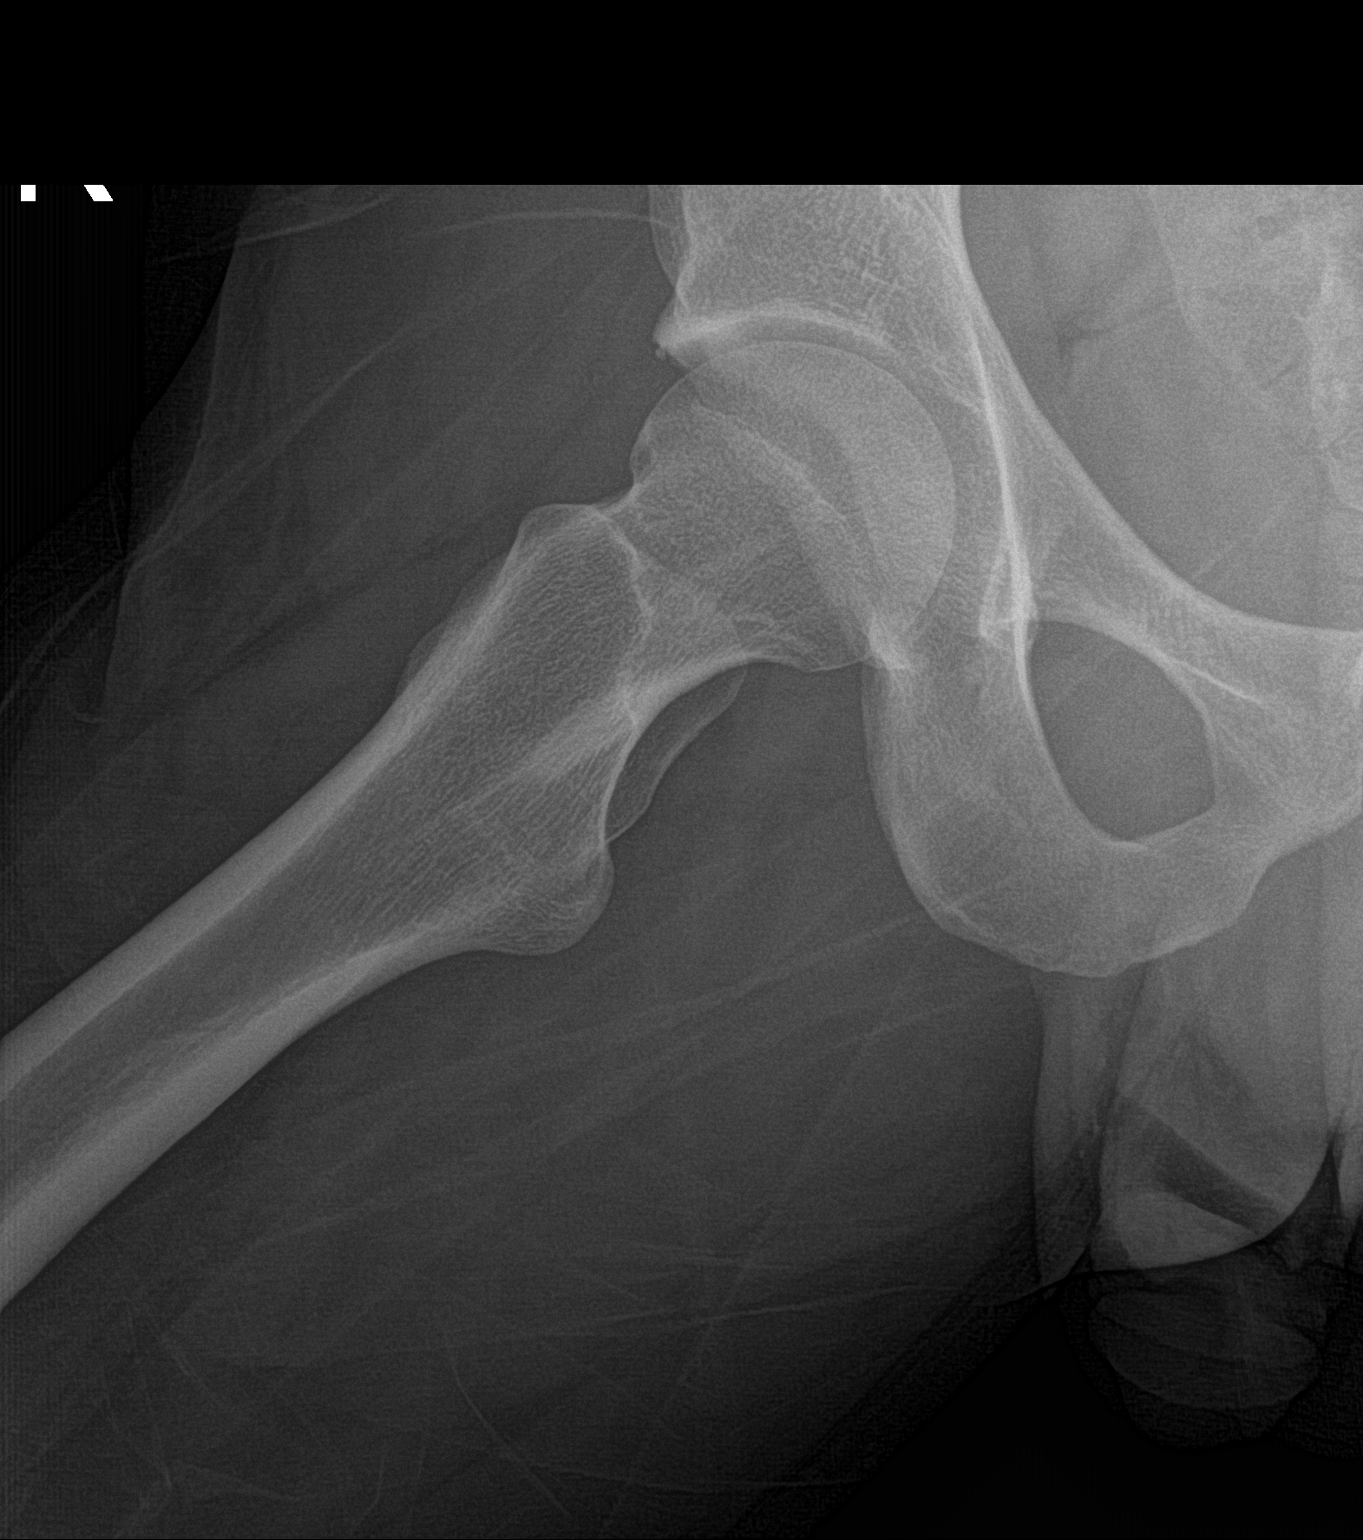

[2 of 2 positions shown; findings below may reference images not displayed]

FINDINGS: There is no evidence of hip fracture or dislocation. There is no
evidence of arthropathy or other focal bone abnormality.
IMPRESSION: Negative.
